# Patient Record
Sex: Female | Born: 1965 | Race: White | Hispanic: No | Marital: Married | Smoking: Former smoker
Health system: Southern US, Community
[De-identification: ages and names within clinical notes are randomized; demographics above are authoritative.]

## PROBLEM LIST (undated history)

## (undated) DIAGNOSIS — H04123 Dry eye syndrome of bilateral lacrimal glands: Secondary | ICD-10-CM

## (undated) DIAGNOSIS — M069 Rheumatoid arthritis, unspecified: Secondary | ICD-10-CM

## (undated) HISTORY — PX: GALLBLADDER SURGERY: SHX652

## (undated) HISTORY — DX: Rheumatoid arthritis, unspecified: M06.9

## (undated) HISTORY — DX: Dry eye syndrome of bilateral lacrimal glands: H04.123

---

## 2002-03-09 ENCOUNTER — Encounter: Payer: Self-pay | Admitting: Rheumatology

## 2002-03-09 ENCOUNTER — Encounter (HOSPITAL_COMMUNITY): Admission: RE | Admit: 2002-03-09 | Discharge: 2002-04-08 | Payer: Self-pay | Admitting: Rheumatology

## 2010-08-01 ENCOUNTER — Ambulatory Visit: Payer: Self-pay | Admitting: Family Medicine

## 2014-03-19 ENCOUNTER — Other Ambulatory Visit: Payer: Self-pay | Admitting: Rheumatology

## 2014-03-19 ENCOUNTER — Ambulatory Visit
Admission: RE | Admit: 2014-03-19 | Discharge: 2014-03-19 | Disposition: A | Discharge status: Home / Self Care | Payer: BC Managed Care – PPO | Source: Ambulatory Visit | Attending: Rheumatology | Admitting: Rheumatology

## 2014-03-19 DIAGNOSIS — M545 Low back pain: Secondary | ICD-10-CM

## 2015-06-15 IMAGING — CR DG LUMBAR SPINE COMPLETE 4+V
5 series · 5 of 5 positions shown · non-contrast
Comparison: None.

CLINICAL DATA: Low back pain worsening over the past month.

EXAM:
LUMBAR SPINE - COMPLETE 4+ VIEW

[view not recorded (1 of 5)]
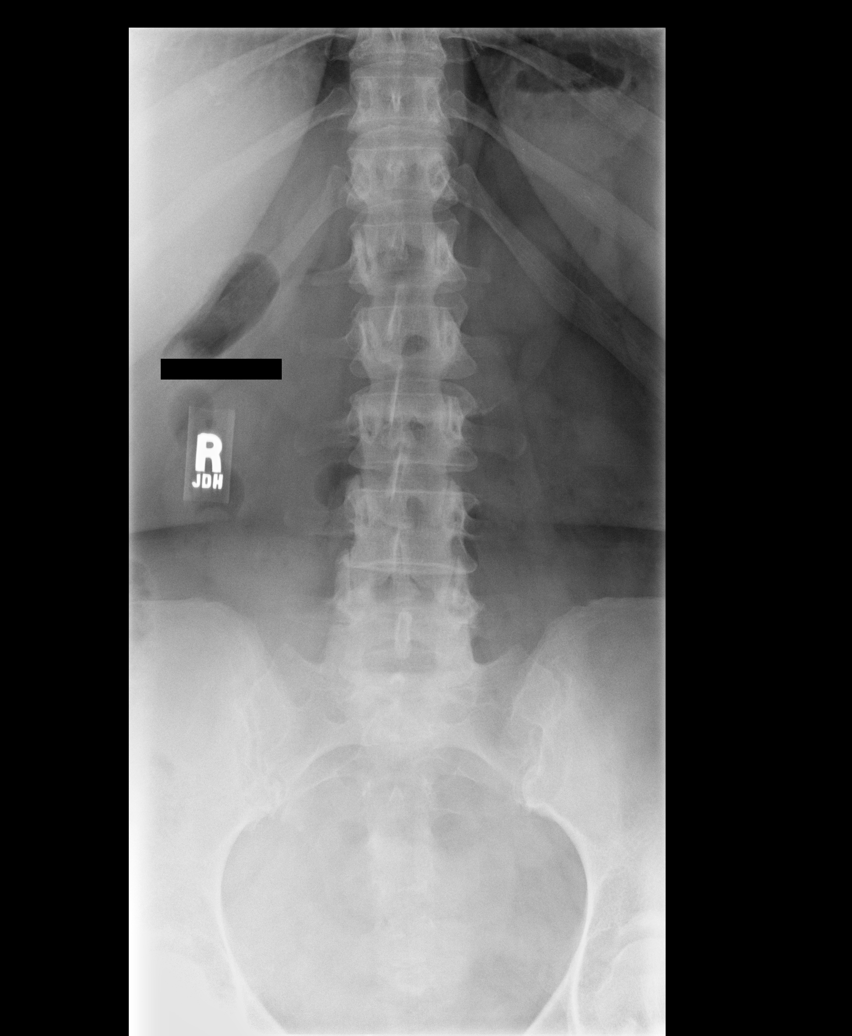

[view not recorded (2 of 5)]
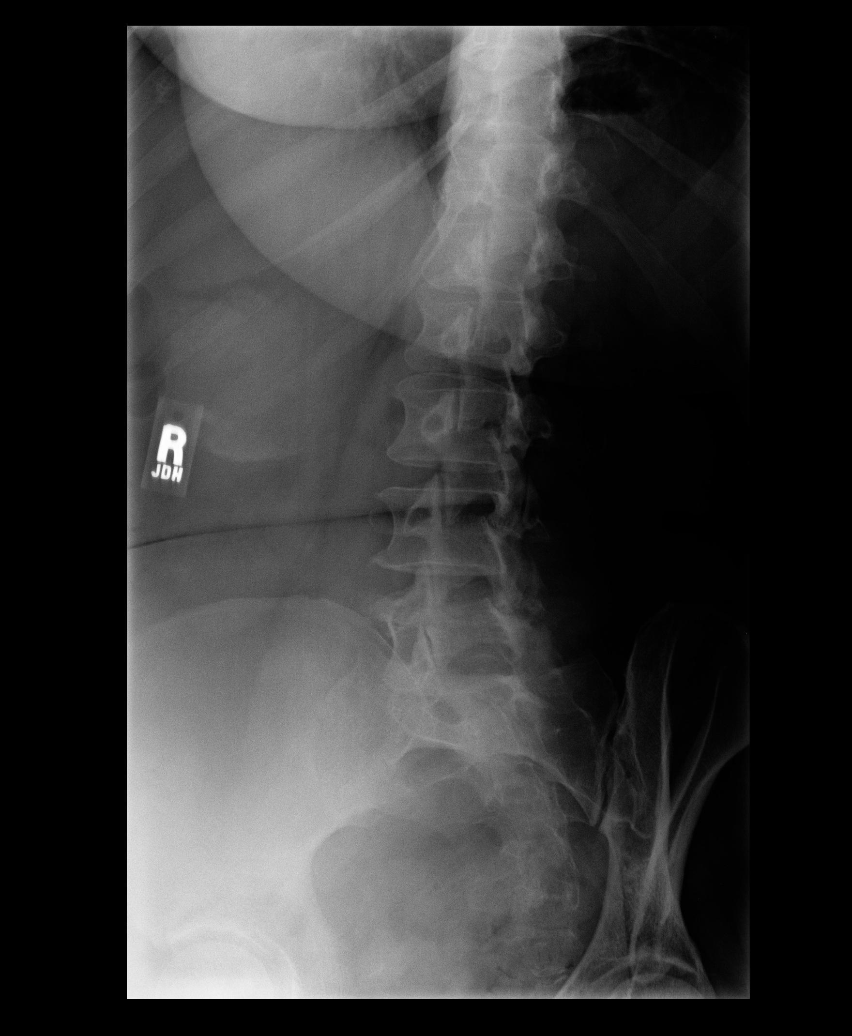

[view not recorded (3 of 5)]
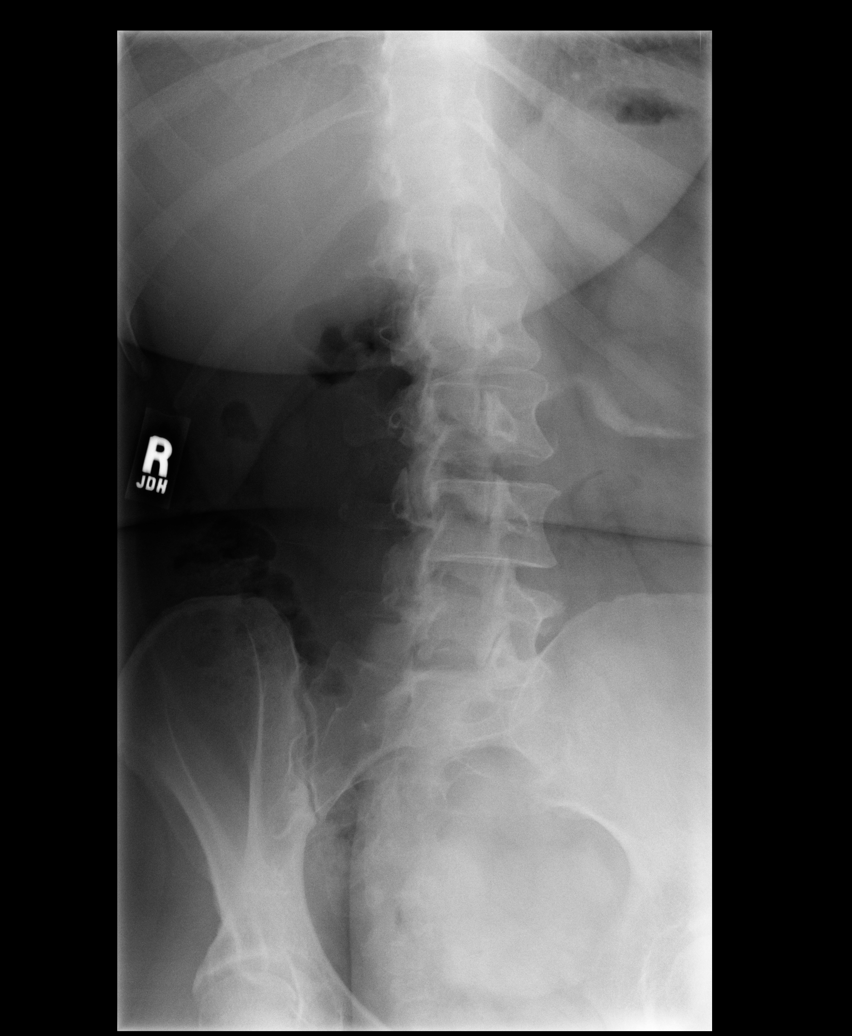

[view not recorded (4 of 5)]
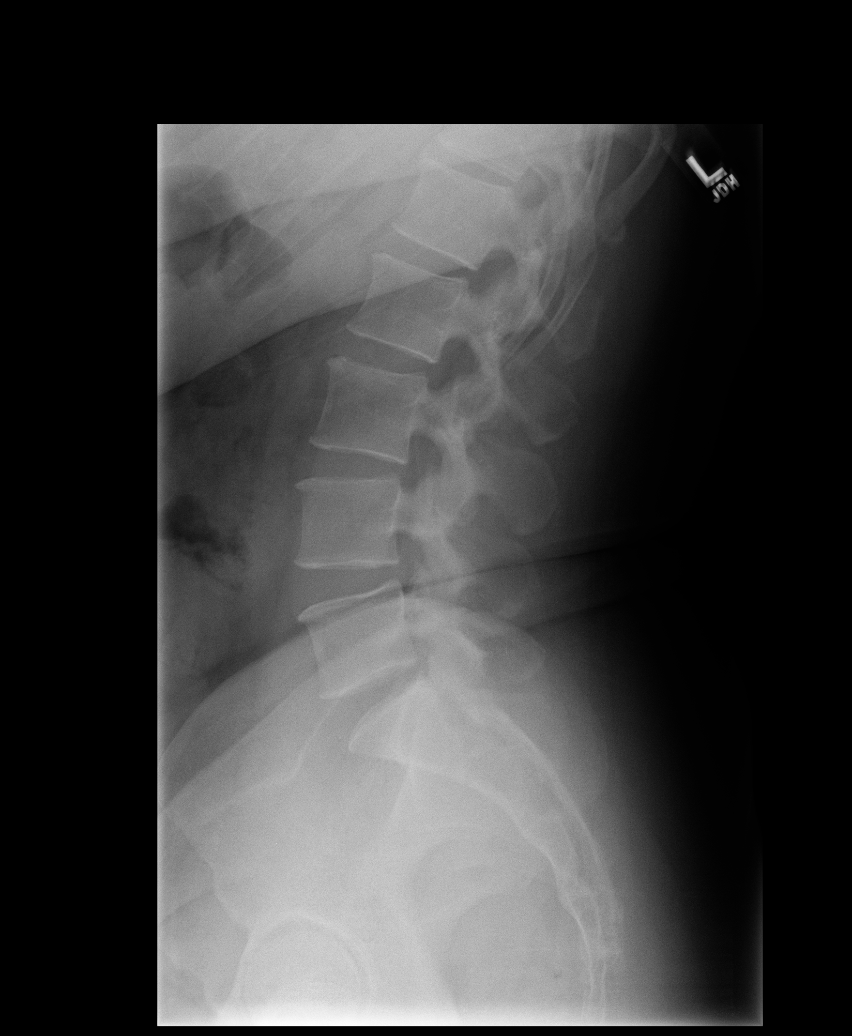

[view not recorded (5 of 5)]
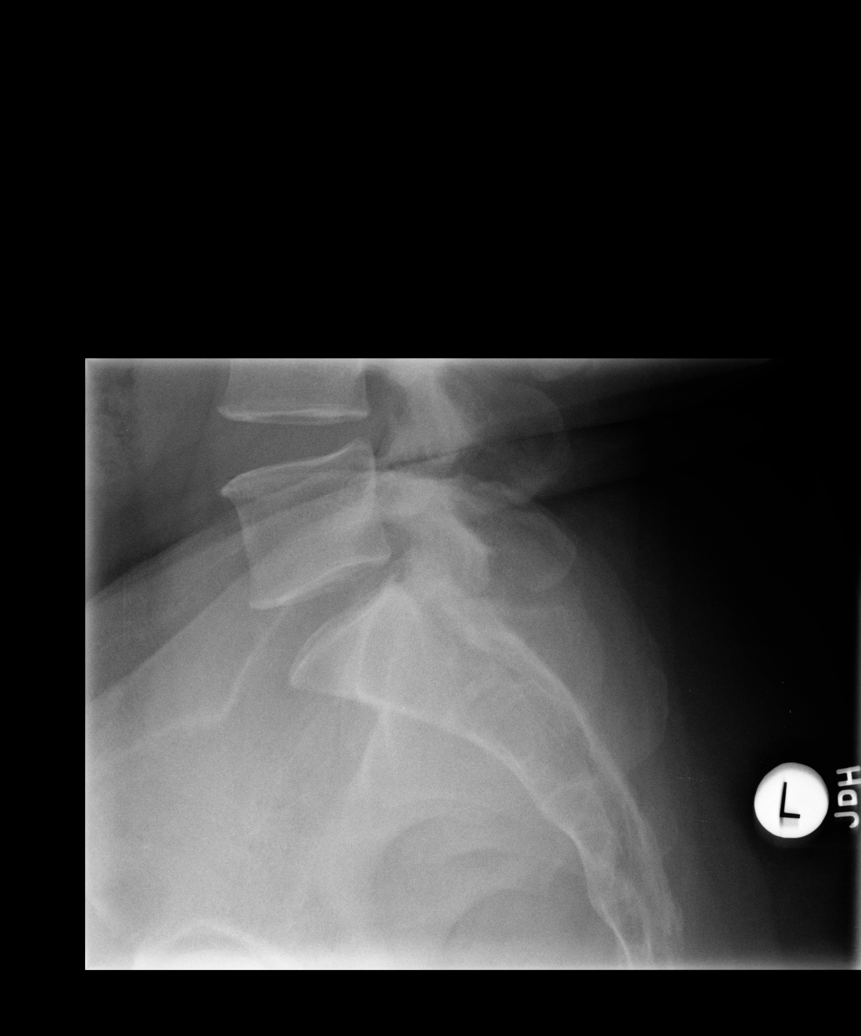

[5 of 5 positions shown; findings below may reference images not displayed]

FINDINGS: Vertebral body height and alignment are maintained. Intervertebral
disc space height is normal. Mild facet arthropathy is seen at
L5-S1. Minimal anterior endplate spurring L3-4 L4-5 is noted.
IMPRESSION: Mild degenerative disease.  Otherwise negative.

## 2016-09-28 DIAGNOSIS — M722 Plantar fascial fibromatosis: Secondary | ICD-10-CM | POA: Insufficient documentation

## 2016-09-28 DIAGNOSIS — M7581 Other shoulder lesions, right shoulder: Secondary | ICD-10-CM

## 2016-09-28 DIAGNOSIS — Z87442 Personal history of urinary calculi: Secondary | ICD-10-CM | POA: Insufficient documentation

## 2016-09-28 DIAGNOSIS — M778 Other enthesopathies, not elsewhere classified: Secondary | ICD-10-CM | POA: Insufficient documentation

## 2016-09-28 DIAGNOSIS — M5136 Other intervertebral disc degeneration, lumbar region: Secondary | ICD-10-CM | POA: Insufficient documentation

## 2016-09-28 DIAGNOSIS — Z79899 Other long term (current) drug therapy: Secondary | ICD-10-CM | POA: Insufficient documentation

## 2016-09-28 DIAGNOSIS — M3500 Sicca syndrome, unspecified: Secondary | ICD-10-CM | POA: Insufficient documentation

## 2016-09-28 DIAGNOSIS — M0579 Rheumatoid arthritis with rheumatoid factor of multiple sites without organ or systems involvement: Secondary | ICD-10-CM | POA: Insufficient documentation

## 2016-09-28 DIAGNOSIS — Z8639 Personal history of other endocrine, nutritional and metabolic disease: Secondary | ICD-10-CM | POA: Insufficient documentation

## 2016-09-28 DIAGNOSIS — Z9049 Acquired absence of other specified parts of digestive tract: Secondary | ICD-10-CM | POA: Insufficient documentation

## 2016-09-28 NOTE — Progress Notes (Signed)
Office Visit Note  Patient: Jessica Rice             Date of Birth: 1965/04/16           MRN: [id number]             PCP: Marrian Salvage, PA-C Referring: Marrian Salvage, PA-C Visit Date: 10/01/2016 Occupation: Teacher     Subjective:  Pain hands   History of Present Illness: Jessica Rice is a 51 y.o. female seen in consultation per request of her PCP. According to patient in 2002 she started having pain in her feet. At the time she was seen by podiatrist who felt that she had rheumatoid arthritis. She states she was seen by her PCP at the time she was having almost swelling in all of her joints. She was placed on prednisone 40 mg a day. She was referred to Dr. Kellie Simmering but it to few months. She states he gradually started weaning her off prednisone as she was asymptomatic on prednisone. When she came down to 10 mg of prednisone a day her symptoms a started flaring again. At that time Dr. Kellie Simmering started on the tracks 8 but it did not help her much. He had it Humira soon after and tapered prednisone gradually. She states 4 years ago she went into remission and stopped all of her medications. She did quite well for a while. But 2 years ago she had another flare for which she was started on Humira. She had a flare in February 2018 for which she was given prednisone. Her symptoms persist with off and on flares. Her PCP added methotrexate in April 2018. She was on 4 tablets by mouth every week and increase to 6 tablets of methotrexate by mouth every week about a month ago. She states she still continues to have flares. Her last flare was a week ago with hand swelling. She showed me the 4 grafts on her phone. She states she does not like taking methotrexate that she does drink some alcohol.  Activities of Daily Living:  Patient reports morning stiffness for 30 minutes.   Patient Reports nocturnal pain.  Difficulty dressing/grooming: Denies Difficulty climbing stairs: Reports Difficulty getting  out of chair: Reports Difficulty using hands for taps, buttons, cutlery, and/or writing: Reports   Review of Systems  Constitutional: Positive for fatigue and weight gain. Negative for night sweats, weight loss and weakness.       Due to prednisone use  HENT: Positive for mouth dryness. Negative for mouth sores, trouble swallowing, trouble swallowing and nose dryness.   Eyes: Positive for dryness. Negative for pain, redness and visual disturbance.  Respiratory: Negative for cough, shortness of breath and difficulty breathing.   Cardiovascular: Negative for chest pain, palpitations, hypertension, irregular heartbeat and swelling in legs/feet.  Gastrointestinal: Negative for blood in stool, constipation and diarrhea.  Endocrine: Negative for increased urination.  Genitourinary: Negative for vaginal dryness.  Musculoskeletal: Positive for arthralgias, joint pain, joint swelling and morning stiffness. Negative for myalgias, muscle weakness, muscle tenderness and myalgias.  Skin: Negative for color change, rash, hair loss, skin tightness, ulcers and sensitivity to sunlight.  Allergic/Immunologic: Negative for susceptible to infections.  Neurological: Negative for dizziness, memory loss and night sweats.  Hematological: Negative for swollen glands.  Psychiatric/Behavioral: Negative for depressed mood and sleep disturbance. The patient is not nervous/anxious.     PMFS History:  Patient Active Problem List   Diagnosis Date Noted  . 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 High risk medication use 09/28/2016  . Right shoulder tendonitis 09/28/2016  . Plantar fasciitis 09/28/2016  . DDD (degenerative disc disease), lumbar 09/28/2016  . History of kidney stones 09/28/2016  . History of cholecystectomy 09/28/2016  . History of hyperlipidemia 09/28/2016  . Sicca syndrome (HCC) 09/28/2016    Past Medical History:  Diagnosis Date  .  Dry eyes     Family History  Problem Relation Age of Onset  . Rheumatologic disease Mother   . Cancer Mother   . Hypertension Father   . Diabetes Brother    Past Surgical History:  Procedure Laterality Date  . GALLBLADDER SURGERY     Social History   Social History Narrative  . No narrative on file     Objective: Vital Signs: BP (!) 141/84   Pulse 98   Resp 14   Ht 5\' 5"  (1.651 m)   Wt 247 lb (112 kg)   BMI 41.10 kg/m    Physical Exam  Constitutional: She is oriented to person, place, and time. She appears well-developed and well-nourished.  HENT:  Head: Normocephalic and atraumatic.  Eyes: Conjunctivae and EOM are normal.  Neck: Normal range of motion.  Cardiovascular: Normal rate, regular rhythm, normal heart sounds and intact distal pulses.   Pulmonary/Chest: Effort normal and breath sounds normal.  Abdominal: Soft. Bowel sounds are normal.  Lymphadenopathy:    She has no cervical adenopathy.  Neurological: She is alert and oriented to person, place, and time.  Skin: Skin is warm and dry. Capillary refill takes less than 2 seconds.  Psychiatric: She has a normal mood and affect. Her behavior is normal.  Nursing note and vitals reviewed.    Musculoskeletal Exam: C-spine and thoracic lumbar spine good range of motion. She is good range of motion of her shoulders elbows wrist joints. She is some thickening of PIP/DIP joints. No synovitis was noted. Hip joints knee joints ankles MTPs PIPs with good range of motion. She appears to have some synovial thickening over her left ankle joint. No synovitis in her lower extremities was noted.  CDAI Exam: CDAI Homunculus Exam:   Tenderness:  Right hand: 3rd PIP LLE: tibiotalar  Joint Counts:  CDAI Tender Joint count: 1 CDAI Swollen Joint count: 0  Global Assessments:  Patient Global Assessment: 3 Provider Global Assessment: 3  CDAI Calculated Score: 7    Investigation: Findings:  4/13/2018CMP shows Glucose  elevated 129, otherwise normal  05/07/2016 extra bilateral hands showed minimal PIP narrowing minimal CMC narrowing no MCP or intercarpal joint space narrowing or erosive changes were noted.     Imaging: Xr Foot 2 Views Left  Result Date: 10/01/2016 PIP/DIP narrowing no MTP joint space narrowing no erosive changes were noted small calcaneal spur was noted. No subpatellar joint space narrowing was noted. Impression these findings were consistent with mild osteoarthritis  Xr Foot 2 Views Right  Result Date: 10/01/2016 PIP/DIP narrowing no MTP joint space narrowing no erosive changes were noted small calcaneal spur was noted. No subpatellar joint space narrowing was noted. Impression these findings were consistent with mild osteoarthritis  Xr Knee 3 View Left  Result Date: 10/01/2016 Moderate medial compartment narrowing was noted., Mild patellofemoral narrowing was noted. No chondrocalcinosis was noted. Impression: These findings are consistent with moderate osteoarthritis and mild chondromalacia patella  Xr Knee 3 View Right  Result Date: 10/01/2016 Moderate medial compartment narrowing was noted. No chondrocalcinosis was noted. Moderate patellofemoral narrowing was noted.  Impression: These studies are consistent with moderate osteoarthritis and moderate chondromalacia patella   Speciality Comments: No specialty comments available.    Procedures:  No procedures performed Allergies: Patient has no known allergies.   Assessment / Plan:     Visit Diagnoses: Rheumatoid arthritis involving multiple sites with positive rheumatoid factor (HCC) +CCP . Patient has known history of rheumatoid arthritis. She showed me a picture on her cell phone where she had synovitis in her right hand about a week ago. She continues to have intermittent flare. It seemed she's not doing as well on the combination of Humira and methotrexate. She's been on Humira for several years. We discussed the option of possibly  switching her to Enbrel. After given her a handout to review. We will obtain following labs today.- Plan: Urinalysis, Routine w reflex microscopic, Sedimentation rate, Rheumatoid factor, Cyclic citrul peptide antibody, IgG  High risk medication use - Humira started by Dr Kellie Simmeringruslow  - Plan: CBC with Differential/Platelet, COMPLETE METABOLIC PANEL WITH GFR, Quantiferon tb gold assay (blood), IgG, IgA, IgM, Serum protein electrophoresis with reflex, Hepatitis B core antibody, IgM, Hepatitis B surface antigen, Hepatitis C antibody, HIV antibody  Chronic pain of both knees - Plan: XR KNEE 3 VIEW RIGHT, XR KNEE 3 VIEW LEFT. 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 She had mild osteoarthritic changes in PIP and DIP joints  Sicca syndrome (HCC): She's been using over-the-counter products  Right shoulder tendonitis: She is intermittent discomfort  DDD (degenerative disc disease), lumbar: Chronic pain  History of kidney stones  History of cholecystectomy  History of hyperlipidemia    Orders: Orders Placed This Encounter  Procedures  . XR KNEE 3 VIEW RIGHT  . XR KNEE 3 VIEW LEFT  . XR Foot 2 Views Right  . XR Foot 2 Views Left  . CBC with Differential/Platelet  . COMPLETE METABOLIC PANEL WITH GFR  . Urinalysis, Routine w reflex microscopic  . Sedimentation rate  . Quantiferon tb gold assay (blood)  . IgG, IgA, IgM  . Serum protein electrophoresis with reflex  . Rheumatoid factor  . Cyclic citrul peptide antibody, IgG  . Hepatitis B core antibody, IgM  . Hepatitis B surface antigen  . Hepatitis C antibody  . HIV antibody   No orders of the defined types were placed in this encounter.   Face-to-face time spent with patient was 50 minutes. 50% of time was spent in counseling and coordination of care.  Follow-Up Instructions: Return for Rheumatoid arthritis.   Pollyann SavoyShaili  Bernice Mullin, MD  Note - This record has been created using Animal nutritionistDragon software.  Chart creation errors have been sought, but may not always  have been located. Such creation errors do not reflect on  the standard of medical care.

## 2016-10-01 ENCOUNTER — Telehealth: Payer: Self-pay

## 2016-10-01 ENCOUNTER — Encounter: Payer: Self-pay | Admitting: Rheumatology

## 2016-10-01 ENCOUNTER — Ambulatory Visit (INDEPENDENT_AMBULATORY_CARE_PROVIDER_SITE_OTHER): Payer: Self-pay

## 2016-10-01 ENCOUNTER — Ambulatory Visit (INDEPENDENT_AMBULATORY_CARE_PROVIDER_SITE_OTHER): Payer: BLUE CROSS/BLUE SHIELD | Admitting: Rheumatology

## 2016-10-01 VITALS — BP 141/84 | HR 98 | Resp 14 | Ht 65.0 in | Wt 247.0 lb

## 2016-10-01 DIAGNOSIS — M3500 Sicca syndrome, unspecified: Secondary | ICD-10-CM

## 2016-10-01 DIAGNOSIS — Z87442 Personal history of urinary calculi: Secondary | ICD-10-CM

## 2016-10-01 DIAGNOSIS — M778 Other enthesopathies, not elsewhere classified: Secondary | ICD-10-CM

## 2016-10-01 DIAGNOSIS — Z8639 Personal history of other endocrine, nutritional and metabolic disease: Secondary | ICD-10-CM

## 2016-10-01 DIAGNOSIS — Z9049 Acquired absence of other specified parts of digestive tract: Secondary | ICD-10-CM | POA: Diagnosis not present

## 2016-10-01 DIAGNOSIS — M79671 Pain in right foot: Secondary | ICD-10-CM | POA: Diagnosis not present

## 2016-10-01 DIAGNOSIS — M0579 Rheumatoid arthritis with rheumatoid factor of multiple sites without organ or systems involvement: Secondary | ICD-10-CM

## 2016-10-01 DIAGNOSIS — M5136 Other intervertebral disc degeneration, lumbar region: Secondary | ICD-10-CM | POA: Diagnosis not present

## 2016-10-01 DIAGNOSIS — M25562 Pain in left knee: Secondary | ICD-10-CM | POA: Diagnosis not present

## 2016-10-01 DIAGNOSIS — G8929 Other chronic pain: Secondary | ICD-10-CM

## 2016-10-01 DIAGNOSIS — M25561 Pain in right knee: Secondary | ICD-10-CM

## 2016-10-01 DIAGNOSIS — M7581 Other shoulder lesions, right shoulder: Secondary | ICD-10-CM | POA: Diagnosis not present

## 2016-10-01 DIAGNOSIS — M79672 Pain in left foot: Secondary | ICD-10-CM | POA: Diagnosis not present

## 2016-10-01 DIAGNOSIS — Z79899 Other long term (current) drug therapy: Secondary | ICD-10-CM

## 2016-10-01 LAB — CBC WITH DIFFERENTIAL/PLATELET
BASOS PCT: 0 %
Basophils Absolute: 0 cells/uL (ref 0–200)
EOS ABS: 141 {cells}/uL (ref 15–500)
Eosinophils Relative: 3 %
HCT: 39.3 % (ref 35.0–45.0)
Hemoglobin: 13.3 g/dL (ref 11.7–15.5)
LYMPHS PCT: 51 %
Lymphs Abs: 2397 cells/uL (ref 850–3900)
MCH: 32.3 pg (ref 27.0–33.0)
MCHC: 33.8 g/dL (ref 32.0–36.0)
MCV: 95.4 fL (ref 80.0–100.0)
MONOS PCT: 13 %
MPV: 11.4 fL (ref 7.5–12.5)
Monocytes Absolute: 611 cells/uL (ref 200–950)
Neutro Abs: 1551 cells/uL (ref 1500–7800)
Neutrophils Relative %: 33 %
PLATELETS: 165 10*3/uL (ref 140–400)
RBC: 4.12 MIL/uL (ref 3.80–5.10)
RDW: 15.2 % — AB (ref 11.0–15.0)
WBC: 4.7 10*3/uL (ref 3.8–10.8)

## 2016-10-01 LAB — COMPLETE METABOLIC PANEL WITH GFR
ALT: 14 U/L (ref 6–29)
AST: 18 U/L (ref 10–35)
Albumin: 3.6 g/dL (ref 3.6–5.1)
Alkaline Phosphatase: 50 U/L (ref 33–130)
BUN: 13 mg/dL (ref 7–25)
CHLORIDE: 107 mmol/L (ref 98–110)
CO2: 21 mmol/L (ref 20–31)
Calcium: 8.9 mg/dL (ref 8.6–10.4)
Creat: 0.96 mg/dL (ref 0.50–1.05)
GFR, EST AFRICAN AMERICAN: 79 mL/min (ref >=60)
GFR, EST NON AFRICAN AMERICAN: 69 mL/min (ref >=60)
Glucose, Bld: 123 mg/dL — ABNORMAL HIGH (ref 65–99)
Potassium: 3.9 mmol/L (ref 3.5–5.3)
Sodium: 138 mmol/L (ref 135–146)
Total Bilirubin: 0.6 mg/dL (ref 0.2–1.2)
Total Protein: 7 g/dL (ref 6.1–8.1)

## 2016-10-01 NOTE — Telephone Encounter (Signed)
Submitted a prior authorization for a new start on Enbrel through cover my meds.   Received a confirmation that the medication has been approved from 10/01/16-10/01/17. Case ID: [id number]    Abran DukeHopkins, Graceland Wachter, CPhT 11:44 AM

## 2016-10-01 NOTE — Patient Instructions (Signed)
Etanercept injection What is this medicine? ETANERCEPT (et a NER sept) is used for the treatment of rheumatoid arthritis in adults and children. The medicine is also used to treat psoriatic arthritis, ankylosing spondylitis, and psoriasis. This medicine may be used for other purposes; ask your health care provider or pharmacist if you have questions. COMMON BRAND NAME(S): Enbrel What should I tell my health care provider before I take this medicine? They need to know if you have any of these conditions: -blood disorders -cancer -congestive heart failure -diabetes -exposure to chickenpox -immune system problems -infection -multiple sclerosis -seizure disorder -tuberculosis, a positive skin test for tuberculosis or have recently been in close contact with someone who has tuberculosis -Wegener's granulomatosis -an unusual or allergic reaction to etanercept, latex, other medicines, foods, dyes, or preservatives -pregnant or trying to get pregnant -breast-feeding How should I use this medicine? The medicine is given by injection under the skin. You will be taught how to prepare and give this medicine. Use exactly as directed. Take your medicine at regular intervals. Do not take your medicine more often than directed. It is important that you put your used needles and syringes in a special sharps container. Do not put them in a trash can. If you do not have a sharps container, call your pharmacist or healthcare provider to get one. A special MedGuide will be given to you by the pharmacist with each prescription and refill. Be sure to read this information carefully each time. Talk to your pediatrician regarding the use of this medicine in children. While this drug may be prescribed for children as young as 4 years of age for selected conditions, precautions do apply. Overdosage: If you think you have taken too much of this medicine contact a poison control center or emergency room at once. NOTE:  This medicine is only for you. Do not share this medicine with others. What if I miss a dose? If you miss a dose, contact your health care professional to find out when you should take your next dose. Do not take double or extra doses without advice. What may interact with this medicine? Do not take this medicine with any of the following medications: -anakinra This medicine may also interact with the following medications: -cyclophosphamide -sulfasalazine -vaccines This list may not describe all possible interactions. Give your health care provider a list of all the medicines, herbs, non-prescription drugs, or dietary supplements you use. Also tell them if you smoke, drink alcohol, or use illegal drugs. Some items may interact with your medicine. What should I watch for while using this medicine? Tell your doctor or healthcare professional if your symptoms do not start to get better or if they get worse. You will be tested for tuberculosis (TB) before you start this medicine. If your doctor prescribes any medicine for TB, you should start taking the TB medicine before starting this medicine. Make sure to finish the full course of TB medicine. Call your doctor or health care professional for advice if you get a fever, chills or sore throat, or other symptoms of a cold or flu. Do not treat yourself. This drug decreases your body's ability to fight infections. Try to avoid being around people who are sick. What side effects may I notice from receiving this medicine? Side effects that you should report to your doctor or health care professional as soon as possible: -allergic reactions like skin rash, itching or hives, swelling of the face, lips, or tongue -changes in vision -fever, chills   or any other sign of infection -numbness or tingling in legs or other parts of the body -red, scaly patches or raised bumps on the skin -shortness of breath or difficulty breathing -swollen lymph nodes in the  neck, underarm, or groin areas -unexplained weight loss -unusual bleeding or bruising -unusual swelling or fluid retention in the legs -unusually weak or tired Side effects that usually do not require medical attention (report to your doctor or health care professional if they continue or are bothersome): -dizziness -headache -nausea -redness, itching, or swelling at the injection site -vomiting This list may not describe all possible side effects. Call your doctor for medical advice about side effects. You may report side effects to FDA at 1-800-FDA-1088. Where should I keep my medicine? Keep out of the reach of children. Store between 2 and 8 degrees C (36 and 46 degrees F). Do not freeze or shake. Protect from light. Throw away any unused medicine after the expiration date. You will be instructed on how to store this medicine. NOTE: This sheet is a summary. It may not cover all possible information. If you have questions about this medicine, talk to your doctor, pharmacist, or health care provider.  2018 Elsevier/Gold Standard (2011-09-21 15:33:36)  

## 2016-10-02 LAB — URINALYSIS, ROUTINE W REFLEX MICROSCOPIC
Bilirubin Urine: NEGATIVE
Glucose, UA: NEGATIVE
Hgb urine dipstick: NEGATIVE
Ketones, ur: NEGATIVE
NITRITE: NEGATIVE
PH: 6.5 (ref 5.0–8.0)
Protein, ur: NEGATIVE
SPECIFIC GRAVITY, URINE: 1.019 (ref 1.001–1.035)

## 2016-10-02 LAB — URINALYSIS, MICROSCOPIC ONLY
Casts: NONE SEEN [LPF]
Crystals: NONE SEEN [HPF]
YEAST: NONE SEEN [HPF]

## 2016-10-02 LAB — HEPATITIS B SURFACE ANTIGEN: Hepatitis B Surface Ag: NEGATIVE

## 2016-10-02 LAB — IGG, IGA, IGM
IGG (IMMUNOGLOBIN G), SERUM: 1642 mg/dL — AB (ref 694–1618)
IgA: 382 mg/dL (ref 81–463)
IgM, Serum: 200 mg/dL (ref 48–271)

## 2016-10-02 LAB — CYCLIC CITRUL PEPTIDE ANTIBODY, IGG

## 2016-10-02 LAB — SEDIMENTATION RATE: Sed Rate: 30 mm/hr (ref 0–30)

## 2016-10-02 LAB — RHEUMATOID FACTOR: Rhuematoid fact SerPl-aCnc: 21 IU/mL — ABNORMAL HIGH (ref <14)

## 2016-10-02 LAB — HIV ANTIBODY (ROUTINE TESTING W REFLEX): HIV: NONREACTIVE

## 2016-10-02 LAB — HEPATITIS B CORE ANTIBODY, IGM: Hep B C IgM: NONREACTIVE

## 2016-10-02 LAB — HEPATITIS C ANTIBODY: HCV AB: NEGATIVE

## 2016-10-03 LAB — QUANTIFERON TB GOLD ASSAY (BLOOD)
INTERFERON GAMMA RELEASE ASSAY: NEGATIVE
Mitogen-Nil: 7.76 IU/mL
QUANTIFERON NIL VALUE: 0.3 [IU]/mL
QUANTIFERON TB AG MINUS NIL: 0.02 [IU]/mL

## 2016-10-05 LAB — PROTEIN ELECTROPHORESIS, SERUM, WITH REFLEX
ALPHA-1-GLOBULIN: 0.5 g/dL — AB (ref 0.2–0.3)
ALPHA-2-GLOBULIN: 0.6 g/dL (ref 0.5–0.9)
Albumin ELP: 3.4 g/dL — ABNORMAL LOW (ref 3.8–4.8)
BETA GLOBULIN: 0.5 g/dL (ref 0.4–0.6)
Beta 2: 0.4 g/dL (ref 0.2–0.5)
GAMMA GLOBULIN: 1.5 g/dL (ref 0.8–1.7)
Total Protein, Serum Electrophoresis: 7 g/dL (ref 6.1–8.1)

## 2016-10-05 NOTE — Progress Notes (Signed)
Labs consistent with rheumatoid arthritis

## 2016-10-05 NOTE — Telephone Encounter (Signed)
I informed patient that Enbrel was approved by her insurance.  Advised patient that first Enbrel dose must be administered in office at least two weeks after most recent Humira.  Patient reports she took most recent Humira 09/27/16.  Discussed starting Enbrel after 10/11/16.  Patient reports she will be on vacation.  She has Humira dose for 10/11/16 and wants to wait until new patient follow up visit on 10/30/16 to start Enbrel.  Advised patient not to take Humira on 10/25/16.  Patient voiced understanding.   I provided patient with information to sign up for Enbrel Support copay card.  Patient denies any questions or concerns at this time.   Lilla Shookachel Henderson, Pharm.D., BCPS, CPP Clinical Pharmacist Pager: [phone] Phone: [phone] 10/05/2016 9:17 AM

## 2016-10-26 NOTE — Progress Notes (Signed)
Office Visit Note  Patient: Jessica Rice             Date of Birth: 09-01-1965           MRN: [id number]             PCP: Aldean Ast, PA-C Referring: No ref. provider found Visit Date: 10/30/2016 Occupation: '@GUAROCC'$ @    Subjective:  Pain in hands   History of Present Illness: Jessica Rice is a 51 y.o. female with history of sero positive rheumatoid arthritis. She's been having increased pain and discomfort in her hands. She has a stiffness in her bilateral feet especially in the morning and some discomfort which gets better during the day. She does have some discomfort in her shoulders at nighttime. Occasional pain in her left elbow. He is stiff in  Activities of Daily Living:  Patient reports morning stiffness for 20 minutes.   Patient Reports nocturnal pain. Shoulders Difficulty dressing/grooming: Denies Difficulty climbing stairs: Denies Difficulty getting out of chair: Denies Difficulty using hands for taps, buttons, cutlery, and/or writing: Denies   Review of Systems  Constitutional: Positive for fatigue. Negative for night sweats, weight gain, weight loss and weakness.  HENT: Positive for mouth dryness. Negative for mouth sores, trouble swallowing, trouble swallowing and nose dryness.   Eyes: Negative for pain, redness, visual disturbance and dryness.  Respiratory: Negative for cough, shortness of breath and difficulty breathing.   Cardiovascular: Negative for chest pain, palpitations, hypertension, irregular heartbeat and swelling in legs/feet.  Gastrointestinal: Negative for blood in stool, constipation and diarrhea.  Endocrine: Negative for increased urination.  Genitourinary: Negative for vaginal dryness.  Musculoskeletal: Positive for arthralgias, joint pain, joint swelling and morning stiffness. Negative for myalgias, muscle weakness, muscle tenderness and myalgias.  Skin: Negative for color change, rash, hair loss, skin tightness, ulcers and sensitivity to  sunlight.  Allergic/Immunologic: Negative for susceptible to infections.  Neurological: Negative for dizziness, memory loss and night sweats.  Hematological: Negative for swollen glands.  Psychiatric/Behavioral: Negative for depressed mood and sleep disturbance. The patient is not nervous/anxious.     PMFS History:  Patient Active Problem List   Diagnosis Date Noted  . Rheumatoid arthritis involving multiple sites with positive rheumatoid factor ([place]) +CCP  09/28/2016  . High risk medication use 09/28/2016  . Right shoulder tendonitis 09/28/2016  . Plantar fasciitis 09/28/2016  . DDD (degenerative disc disease), lumbar 09/28/2016  . History of kidney stones 09/28/2016  . History of cholecystectomy 09/28/2016  . History of hyperlipidemia 09/28/2016  . Sicca syndrome ([place]) 09/28/2016    Past Medical History:  Diagnosis Date  . Dry eyes     Family History  Problem Relation Age of Onset  . Rheumatologic disease Mother   . Cancer Mother   . Hypertension Father   . Diabetes Brother    Past Surgical History:  Procedure Laterality Date  . GALLBLADDER SURGERY     Social History   Social History Narrative  . No narrative on file     Objective: Vital Signs: BP (!) 128/93 (BP Location: Left Arm, Patient Position: Sitting, Cuff Size: Normal)   Pulse 92   Ht 5' 5.5" (1.664 m)   Wt 245 lb (111.1 kg)   BMI 40.15 kg/m    Physical Exam  Constitutional: She is oriented to person, place, and time. She appears well-developed and well-nourished.  HENT:  Head: Normocephalic and atraumatic.  Eyes: Conjunctivae and EOM are normal.  Neck: Normal range of motion.  Cardiovascular:  Normal rate, regular rhythm, normal heart sounds and intact distal pulses.   Pulmonary/Chest: Effort normal and breath sounds normal.  Abdominal: Soft. Bowel sounds are normal.  Lymphadenopathy:    She has no cervical adenopathy.  Neurological: She is alert and oriented to person, place, and time.  Skin:  Skin is warm and dry. Capillary refill takes less than 2 seconds.  Psychiatric: She has a normal mood and affect. Her behavior is normal.  Nursing note and vitals reviewed.    Musculoskeletal Exam: C-spine and thoracic lumbar spine good range of motion. Shoulder joints elbow joints wrist joints are good range of motion. She has some synovitis over her PIP joints as described below. Hip joints, knee joints, ankles and MTPs were good range of motion with no synovitis.  CDAI Exam: CDAI Homunculus Exam:   Tenderness:  RUE: glenohumeral LUE: glenohumeral Right hand: 2nd PIP, 3rd PIP, 4th PIP and 5th PIP Left hand: 2nd PIP  Swelling:  Right hand: 3rd PIP, 4th PIP and 5th PIP  Joint Counts:  CDAI Tender Joint count: 7 CDAI Swollen Joint count: 3  Global Assessments:  Patient Global Assessment: 2 Provider Global Assessment: 2  CDAI Calculated Score: 14    Investigation: No additional findings. July 15,018 CBC normal, CMP glucose 123, UA negative, SPEP nonspecific, immunoglobulins IgG mildly elevated, TB gold negative, hepatitis panel negative, HIV negative, ESR 30, RF 21, anti-CCP greater than 250  Imaging: Xr Foot 2 Views Left  Result Date: 10/01/2016 PIP/DIP narrowing no MTP joint space narrowing no erosive changes were noted small calcaneal spur was noted. No subpatellar joint space narrowing was noted. Impression these findings were consistent with mild osteoarthritis  Xr Foot 2 Views Right  Result Date: 10/01/2016 PIP/DIP narrowing no MTP joint space narrowing no erosive changes were noted small calcaneal spur was noted. No subpatellar joint space narrowing was noted. Impression these findings were consistent with mild osteoarthritis  Xr Knee 3 View Left  Result Date: 10/01/2016 Moderate medial compartment narrowing was noted., Mild patellofemoral narrowing was noted. No chondrocalcinosis was noted. Impression: These findings are consistent with moderate osteoarthritis and  mild chondromalacia patella  Xr Knee 3 View Right  Result Date: 10/01/2016 Moderate medial compartment narrowing was noted. No chondrocalcinosis was noted. Moderate patellofemoral narrowing was noted. Impression: These studies are consistent with moderate osteoarthritis and moderate chondromalacia patella   Speciality Comments: No specialty comments available.    Procedures:  No procedures performed Allergies: Patient has no known allergies.   Assessment / Plan:     Visit Diagnoses: Rheumatoid arthritis involving multiple sites with positive rheumatoid factor (HCC) +CCP she's been having some pain and stiffness in her bilateral hands. She has synovitis in her PIPs as described above. She has done quite well on Humira in the past which is ineffective now. We will start her on her first Enbrel injection today and she'll be observed in the office for 30 minutes. Indications side effects contraindications were reviewed.  High risk medication use - Methotrexate 4 tablets by mouth every week, folic acid 1 mg by mouth daily, Humira 40 mg subcutaneous every other OLMB(8675) (plan start Enbrel) starting first Enbrel injection today. Then she will take Enbrel once a week. We will check labs in a month and then every 3 months to monitor for drug toxicity. - Plan: CBC with Differential/Platelet, CMP14+EGFR  Sicca syndrome (HCC) - Restasis eyedrops  Right shoulder tendonitis: She does have some nocturnal pain in her shoulders.  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 Weight loss diet and exercise was discussed.  Plantar fasciitis: Doing better  Primary osteoarthritis of both feet: Proper fitting shoes discussed.  DDD (degenerative disc disease), lumbar: Chronic pain  History of hyperlipidemia  History of kidney stones   Class III obesity: Weight loss diet and exercise was discussed.  Association of heart disease with  rheumatoid arthritis was discussed. Need to monitor blood pressure, cholesterol, and to exercise 30-60 minutes on daily basis was discussed. Poor dental hygiene can be a predisposing factor for rheumatoid arthritis. Good dental hygiene was discussed.   Orders: Orders Placed This Encounter  Procedures  . CBC with Differential/Platelet  . CMP14+EGFR   Meds ordered this encounter  Medications  . folic acid (FOLVITE) 1 MG tablet    Sig: Take 1 tablet (1 mg total) by mouth daily.    Dispense:  90 tablet    Refill:  3    Face-to-face time spent with patient was 30 minutes. Greater than 50% of time was spent in counseling and coordination of care.  Follow-Up Instructions: Return in about 3 months (around 01/30/2017) for Rheumatoid arthritis.   Bo Merino, MD  Note - This record has been created using Editor, commissioning.  Chart creation errors have been sought, but may not always  have been located. Such creation errors do not reflect on  the standard of medical care.

## 2016-10-30 ENCOUNTER — Ambulatory Visit (INDEPENDENT_AMBULATORY_CARE_PROVIDER_SITE_OTHER): Payer: BLUE CROSS/BLUE SHIELD | Admitting: Rheumatology

## 2016-10-30 ENCOUNTER — Encounter: Payer: Self-pay | Admitting: Rheumatology

## 2016-10-30 VITALS — BP 124/91 | HR 89 | Ht 65.5 in | Wt 245.0 lb

## 2016-10-30 DIAGNOSIS — M722 Plantar fascial fibromatosis: Secondary | ICD-10-CM

## 2016-10-30 DIAGNOSIS — M170 Bilateral primary osteoarthritis of knee: Secondary | ICD-10-CM | POA: Diagnosis not present

## 2016-10-30 DIAGNOSIS — M3500 Sicca syndrome, unspecified: Secondary | ICD-10-CM | POA: Diagnosis not present

## 2016-10-30 DIAGNOSIS — M5136 Other intervertebral disc degeneration, lumbar region: Secondary | ICD-10-CM

## 2016-10-30 DIAGNOSIS — M0579 Rheumatoid arthritis with rheumatoid factor of multiple sites without organ or systems involvement: Secondary | ICD-10-CM | POA: Diagnosis not present

## 2016-10-30 DIAGNOSIS — M7581 Other shoulder lesions, right shoulder: Secondary | ICD-10-CM

## 2016-10-30 DIAGNOSIS — Z8639 Personal history of other endocrine, nutritional and metabolic disease: Secondary | ICD-10-CM | POA: Diagnosis not present

## 2016-10-30 DIAGNOSIS — Z79899 Other long term (current) drug therapy: Secondary | ICD-10-CM

## 2016-10-30 DIAGNOSIS — Z87442 Personal history of urinary calculi: Secondary | ICD-10-CM

## 2016-10-30 DIAGNOSIS — M19071 Primary osteoarthritis, right ankle and foot: Secondary | ICD-10-CM | POA: Diagnosis not present

## 2016-10-30 DIAGNOSIS — M19072 Primary osteoarthritis, left ankle and foot: Secondary | ICD-10-CM

## 2016-10-30 DIAGNOSIS — M778 Other enthesopathies, not elsewhere classified: Secondary | ICD-10-CM

## 2016-10-30 DIAGNOSIS — Z6841 Body Mass Index (BMI) 40.0 and over, adult: Secondary | ICD-10-CM

## 2016-10-30 MED ORDER — FOLIC ACID 1 MG PO TABS: 1.0000 mg | ORAL_TABLET | Freq: Every day | ORAL | 3 refills | Status: DC

## 2016-10-30 MED ORDER — ETANERCEPT 50 MG/ML ~~LOC~~ SOAJ: 50.0000 mg | SUBCUTANEOUS | 2 refills | Status: DC

## 2016-10-30 NOTE — Progress Notes (Signed)
Pharmacy Note  Subjective: Patient presents today to the [facility] to see Dr. Corliss Skainseveshwar.  Patient was seen last on 10/01/16.  The plan was to stop Humira and apply for Enbrel.  Enbrel has been approved by patient's insurance and patient plans to get initial Enbrel injection today.  She confirms most recent Humria was on 715/18, which was more than 2 weeks ago.  Patient seen by the pharmacist for counseling on Enbrel.    Objective: TB Test: negative (10/01/16) Hepatitis panel: negative (10/01/16) HIV: negative (10/01/16)  CBC    Component Value Date/Time   WBC 4.7 10/01/2016 1235   RBC 4.12 10/01/2016 1235   HGB 13.3 10/01/2016 1235   HCT 39.3 10/01/2016 1235   PLT 165 10/01/2016 1235   MCV 95.4 10/01/2016 1235   MCH 32.3 10/01/2016 1235   MCHC 33.8 10/01/2016 1235   RDW 15.2 (H) 10/01/2016 1235   LYMPHSABS 2,397 10/01/2016 1235   MONOABS 611 10/01/2016 1235   EOSABS 141 10/01/2016 1235   BASOSABS 0 10/01/2016 1235    Assessment/Plan:  Counseled patient that Enbrel is a TNF blocking agent.  Reviewed Enbrel dose of 50 mg once weekly.  Counseled patient on purpose, proper use, and adverse effects of Enbrel.  Reviewed the most common adverse effects including infections, headache, and injection site reactions. Discussed that there is the possibility of an increased risk of malignancy but it is not well understood if this increased risk is due to the medication or the disease state.  Advised patient to get yearly dermatology exams due to risk of skin cancer.  Reviewed the importance of regular labs while on Enbrel therapy.  Advised patient to get standing labs one month after starting Enbrel then every 3 months.  Provided patient with standing lab orders.  Counseled patient that Enbrel should be held prior to scheduled surgery.  Counseled patient to avoid live vaccines while on Enbrel.  Advised patient to get annual influenza vaccine and the pneumococcal vaccine as needed.  Provided  patient with medication education material and answered all questions.  Patient voiced understanding.  Patient consented to Enbrel.  Will upload consent into the media tab.  Reviewed storage instructions for Enbrel.    Patient was counseled on how to administer subcutaneous Enbrel injection using a demonstration pen.  Patient received her first dose of Enbrel in the right thigh.  Lot: [id number], Exp. 07/20.  Patient was monitored for 30 minutes post injection.  No injection site reaction noted.    Patient is currently taking methotrexate 6 tablets weekly and over the counter folic acid. I counseled patient that she should be taking at least 1 mg of folic acid daily.  Folic acid prescription was sent to the pharmacy.  I do not see a chest x-ray on patient's profile.  Patient confirms she had a chest [facility] at [facility] in the past year.  I called [facility] and spoke to [place] and requested a copy of the chest x-ray results.  She confirms they will fax over the results.    Lilla Shookachel Otila Starn, Pharm.D., BCPS Clinical Pharmacist Pager: [phone] Phone: [phone] 10/30/2016 11:21 AM

## 2016-10-30 NOTE — Patient Instructions (Addendum)
Standing Labs We placed an order today for your standing lab work.    Please come back and get your standing labs in 1 month then every 3 months  We have open lab Monday through Friday from 8:30-11:30 AM and 1:30-4 PM at the office of Dr. Pollyann SavoyShaili Deveshwar.   The office is located at [address], [address], [place], [place] [address] No appointment is necessary.   Labs are drawn by [facility].  You may receive a bill from [facility] for your lab work. If you have any questions regarding directions or hours of operation,  please call [phone].    Etanercept injection What is this medicine? ETANERCEPT (et a MotorolaER sept) is used for the treatment of rheumatoid arthritis in adults and children. The medicine is also used to treat psoriatic arthritis, ankylosing spondylitis, and psoriasis. This medicine may be used for other purposes; ask your health care provider or pharmacist if you have questions. COMMON BRAND NAME(S): Enbrel What should I tell my health care provider before I take this medicine? They need to know if you have any of these conditions: -blood disorders -cancer -congestive heart failure -diabetes -exposure to chickenpox -immune system problems -infection -multiple sclerosis -seizure disorder -tuberculosis, a positive skin test for tuberculosis or have recently been in close contact with someone who has tuberculosis -Wegener's granulomatosis -an unusual or allergic reaction to etanercept, latex, other medicines, foods, dyes, or preservatives -pregnant or trying to get pregnant -breast-feeding How should I use this medicine? The medicine is given by injection under the skin. You will be taught how to prepare and give this medicine. Use exactly as directed. Take your medicine at regular intervals. Do not take your medicine more often than directed. It is important that you put your used needles and syringes in a special sharps container. Do not put them in a trash can. If you  do not have a sharps container, call your pharmacist or healthcare provider to get one. A special MedGuide will be given to you by the pharmacist with each prescription and refill. Be sure to read this information carefully each time. Talk to your pediatrician regarding the use of this medicine in children. While this drug may be prescribed for children as young as 314 years of age for selected conditions, precautions do apply. Overdosage: If you think you have taken too much of this medicine contact a poison control center or emergency room at once. NOTE: This medicine is only for you. Do not share this medicine with others. What if I miss a dose? If you miss a dose, contact your health care professional to find out when you should take your next dose. Do not take double or extra doses without advice. What may interact with this medicine? Do not take this medicine with any of the following medications: -anakinra This medicine may also interact with the following medications: -cyclophosphamide -sulfasalazine -vaccines This list may not describe all possible interactions. Give your health care provider a list of all the medicines, herbs, non-prescription drugs, or dietary supplements you use. Also tell them if you smoke, drink alcohol, or use illegal drugs. Some items may interact with your medicine. What should I watch for while using this medicine? Tell your doctor or healthcare professional if your symptoms do not start to get better or if they get worse. You will be tested for tuberculosis (TB) before you start this medicine. If your doctor prescribes any medicine for TB, you should start taking the TB medicine before starting  this medicine. Make sure to finish the full course of TB medicine. Call your doctor or health care professional for advice if you get a fever, chills or sore throat, or other symptoms of a cold or flu. Do not treat yourself. This drug decreases your body's ability to fight  infections. Try to avoid being around people who are sick. What side effects may I notice from receiving this medicine? Side effects that you should report to your doctor or health care professional as soon as possible: -allergic reactions like skin rash, itching or hives, swelling of the face, lips, or tongue -changes in vision -fever, chills or any other sign of infection -numbness or tingling in legs or other parts of the body -red, scaly patches or raised bumps on the skin -shortness of breath or difficulty breathing -swollen lymph nodes in the neck, underarm, or groin areas -unexplained weight loss -unusual bleeding or bruising -unusual swelling or fluid retention in the legs -unusually weak or tired Side effects that usually do not require medical attention (report to your doctor or health care professional if they continue or are bothersome): -dizziness -headache -nausea -redness, itching, or swelling at the injection site -vomiting This list may not describe all possible side effects. Call your doctor for medical advice about side effects. You may report side effects to FDA at 1-800-FDA-1088. Where should I keep my medicine? Keep out of the reach of children. Store between 2 and 8 degrees C (36 and 46 degrees F). Do not freeze or shake. Protect from light. Throw away any unused medicine after the expiration date. You will be instructed on how to store this medicine. NOTE: This sheet is a summary. It may not cover all possible information. If you have questions about this medicine, talk to your doctor, pharmacist, or health care provider.  2018 Elsevier/Gold Standard (2011-09-21 15:33:36)  Association of heart disease with rheumatoid arthritis was discussed. Need to monitor blood pressure, cholesterol, and to exercise 30-60 minutes on daily basis was discussed. Poor dental hygiene can be a predisposing factor for rheumatoid arthritis. Good dental hygiene was discussed.

## 2016-11-02 ENCOUNTER — Telehealth: Payer: Self-pay

## 2016-11-02 NOTE — Telephone Encounter (Signed)
Called patient to set up [facility]. Left message for patient to call back.   Hailea Eaglin, [place], CPhT 8:40 AM

## 2016-11-04 MED FILL — ENBREL 50 MG/ML SURECLICK S: 50 | 28 days supply | Qty: 4 | Fill #0

## 2016-11-05 ENCOUNTER — Telehealth: Payer: Self-pay | Admitting: Rheumatology

## 2016-11-05 NOTE — Telephone Encounter (Signed)
Patient called when computers were down 11/04/16. Returning your call.

## 2016-12-01 MED FILL — ENBREL 50 MG/ML SURECLICK S: 50 | 28 days supply | Qty: 4 | Fill #1

## 2016-12-15 ENCOUNTER — Telehealth: Payer: Self-pay | Admitting: *Deleted

## 2016-12-15 NOTE — Telephone Encounter (Signed)
Lab received that were drawn on 12/03/16. Reviewed by Dr. Corliss Skains Creatinine 1.09 GFR 59 All other results WNL. Per Dr. Corliss Skains patient to decrease MTX to 5 tabs per week. Patient advised and verbalized understanding.

## 2016-12-31 MED FILL — ENBREL 50 MG/ML SURECLICK S: 50 | 28 days supply | Qty: 4 | Fill #2

## 2017-01-17 NOTE — Progress Notes (Signed)
Office Visit Note  Patient: Jessica Rice             Date of Birth: [birth year]           MRN: [id number]             PCP: Marrian Salvage, PA-C Referring: Marrian Salvage, PA-C Visit Date: 01/29/2017 Occupation: @GUAROCC @    Subjective:  Joint stiffness.   History of Present Illness: Jessica Rice is a 51 y.o. female with history of sero positive rheumatoid arthritis. She states she's been experiencing some stiffness in her hands but no joint swelling. She seems to be doing well on Enbrel and methotrexate combination. She's been using Restasis for sicca symptoms which has been helpful. She had some left knee joint discomfort with the weather change but it is better now. The plantar fasciitis has resolved. She still has lower back pain off and on.  Activities of Daily Living:  Patient reports morning stiffness for 15 minutes.   Patient Denies nocturnal pain.  Difficulty dressing/grooming: Denies Difficulty climbing stairs: Denies Difficulty getting out of chair: Denies Difficulty using hands for taps, buttons, cutlery, and/or writing: Denies   Review of Systems  Constitutional: Negative for fatigue, night sweats, weight gain, weight loss and weakness.  HENT: Positive for mouth dryness. Negative for mouth sores, trouble swallowing, trouble swallowing and nose dryness.   Eyes: Positive for dryness. Negative for pain, redness and visual disturbance.  Respiratory: Negative for cough, shortness of breath and difficulty breathing.   Cardiovascular: Positive for hypertension. Negative for chest pain, palpitations, irregular heartbeat and swelling in legs/feet.  Gastrointestinal: Negative for blood in stool, constipation and diarrhea.  Endocrine: Negative for increased urination.  Genitourinary: Negative for vaginal dryness.  Musculoskeletal: Positive for arthralgias, joint pain and morning stiffness. Negative for joint swelling, myalgias, muscle weakness, muscle tenderness and myalgias.   Skin: Negative for color change, rash, hair loss, skin tightness, ulcers and sensitivity to sunlight.  Allergic/Immunologic: Negative for susceptible to infections.  Neurological: Negative for dizziness, memory loss and night sweats.  Hematological: Negative for swollen glands.  Psychiatric/Behavioral: Negative for depressed mood and sleep disturbance. The patient is not nervous/anxious.     PMFS History:  Patient Active Problem List   Diagnosis Date Noted  . Rheumatoid arthritis involving multiple sites with positive rheumatoid factor (HCC) +CCP  09/28/2016  . High risk medication use 09/28/2016  . Right shoulder tendonitis 09/28/2016  . Plantar fasciitis 09/28/2016  . DDD (degenerative disc disease), lumbar 09/28/2016  . History of kidney stones 09/28/2016  . History of cholecystectomy 09/28/2016  . History of hyperlipidemia 09/28/2016  . Sicca syndrome (HCC) 09/28/2016    Past Medical History:  Diagnosis Date  . Dry eyes     Family History  Problem Relation Age of Onset  . Rheumatologic disease Mother   . Cancer Mother   . Hypertension Father   . Diabetes Brother    Past Surgical History:  Procedure Laterality Date  . GALLBLADDER SURGERY     Social History   Social History Narrative  . No narrative on file     Objective: Vital Signs: BP 138/80 (BP Location: Left Arm, Patient Position: Sitting, Cuff Size: Normal)   Pulse 75   Resp 17   Ht 5\' 5"  (1.651 m)   Wt 236 lb (107 kg)   BMI 39.27 kg/m    Physical Exam  Constitutional: She is oriented to person, place, and time. She appears well-developed and well-nourished.  HENT:  Head:  Normocephalic and atraumatic.  Eyes: Conjunctivae and EOM are normal.  Neck: Normal range of motion.  Cardiovascular: Normal rate, regular rhythm, normal heart sounds and intact distal pulses.   Pulmonary/Chest: Effort normal and breath sounds normal.  Abdominal: Soft. Bowel sounds are normal.  Lymphadenopathy:    She has no  cervical adenopathy.  Neurological: She is alert and oriented to person, place, and time.  Skin: Skin is warm and dry. Capillary refill takes less than 2 seconds.  Psychiatric: She has a normal mood and affect. Her behavior is normal.  Nursing note and vitals reviewed.    Musculoskeletal Exam: C-spine and thoracic lumbar spine good range of motion. Shoulder joints elbow joints wrist joint MCPs PIPs DIPs with good range of motion with no synovitis. Hip joints knee joints ankles MTPs PIPs DIPs with good range of motion with no synovitis.  CDAI Exam: CDAI Homunculus Exam:   Joint Counts:  CDAI Tender Joint count: 0 CDAI Swollen Joint count: 0  Global Assessments:  Patient Global Assessment: 1 Provider Global Assessment: 1  CDAI Calculated Score: 2    Investigation: No additional findings.TB Gold: Negative in 09/2016 CBC Latest Ref Rng & Units 10/01/2016  WBC 3.8 - 10.8 K/uL 4.7  Hemoglobin 11.7 - 15.5 g/dL 04.513.3  Hematocrit 40.935.0 - 45.0 % 39.3  Platelets 140 - 400 K/uL 165   CMP Latest Ref Rng & Units 10/01/2016  Glucose 65 - 99 mg/dL 811(B123(H)  BUN 7 - 25 mg/dL 13  Creatinine 1.470.50 - 8.291.05 mg/dL 5.620.96  Sodium 130135 - 865146 mmol/L 138  Potassium 3.5 - 5.3 mmol/L 3.9  Chloride 98 - 110 mmol/L 107  CO2 20 - 31 mmol/L 21  Calcium 8.6 - 10.4 mg/dL 8.9  Total Protein 6.1 - 8.1 g/dL 7.0  Total Bilirubin 0.2 - 1.2 mg/dL 0.6  Alkaline Phos 33 - 130 U/L 50  AST 10 - 35 U/L 18  ALT 6 - 29 U/L 14    Imaging: No results found.  Speciality Comments: No specialty comments available.    Procedures:  No procedures performed Allergies: Patient has no known allergies.   Assessment / Plan:     Visit Diagnoses: Rheumatoid arthritis involving multiple sites with positive rheumatoid factor (HCC) +CCP . Patient continues to do well on methotrexate and Enbrel combination. She had no synovitis on examination today. She has minimal morning stiffness in the morning.  High risk medication use - Enbrel  50 mg subcutaneous every week, MTX 3 tablets by mouth every week, folic acid 1 mg by mouth daily - Plan: CBC with Differential/Platelet, COMPLETE METABOLIC PANEL WITH GFR today and then every 3 months.  Sicca syndrome (HCC) - Restasis eyedrops have been helpful.  Right shoulder tendonitis - She does have some nocturnal pain in her shoulders. The symptoms have improved.  Primary osteoarthritis of both knees: She is off and on discomfort in her knee joint.  Primary osteoarthritis of both feet: Proper fitting shoes were discussed.  DDD (degenerative disc disease), lumbar: She is intermittent pain.  Essential hypertension: Patient was started on Bystolic recently by her PCP.  History of hyperlipidemia  History of kidney stones  History of cholecystectomy  History of obesity : Weight loss diet and exercise was discussed.  Association of heart disease with rheumatoid arthritis was discussed. Need to monitor blood pressure, cholesterol, and to exercise 30-60 minutes on daily basis was discussed. Poor dental hygiene can be a predisposing factor for rheumatoid arthritis. Good dental hygiene was discussed.  Orders:  Orders Placed This Encounter  Procedures  . CBC with Differential/Platelet  . COMPLETE METABOLIC PANEL WITH GFR   No orders of the defined types were placed in this encounter.   Face-to-face time spent with patient was 30 minutes. Greater than 50% of time was spent in counseling and coordination of care.  Follow-Up Instructions: Return in about 5 months (around 06/29/2017) for Rheumatoid arthritis, Osteoarthritis.   Pollyann Savoy, MD  Note - This record has been created using Animal nutritionist.  Chart creation errors have been sought, but may not always  have been located. Such creation errors do not reflect on  the standard of medical care.

## 2017-01-22 ENCOUNTER — Other Ambulatory Visit: Payer: Self-pay | Admitting: Rheumatology

## 2017-01-22 NOTE — Telephone Encounter (Signed)
Last Visit: 10/30/16 Next Visit: 01/29/17 Labs: 12/03/16 Creat 1.09 GFR 59 Previously normal  TB Gold: 10/01/16 Neg  Okay to refill per Dr. Corliss Skainseveshwar

## 2017-01-26 MED FILL — ENBREL 50 MG/ML SURECLICK S: 50 | 28 days supply | Qty: 4 | Fill #0

## 2017-01-29 ENCOUNTER — Encounter (INDEPENDENT_AMBULATORY_CARE_PROVIDER_SITE_OTHER): Payer: Self-pay

## 2017-01-29 ENCOUNTER — Ambulatory Visit (INDEPENDENT_AMBULATORY_CARE_PROVIDER_SITE_OTHER): Payer: BLUE CROSS/BLUE SHIELD | Admitting: Rheumatology

## 2017-01-29 ENCOUNTER — Encounter: Payer: Self-pay | Admitting: Rheumatology

## 2017-01-29 VITALS — BP 138/80 | HR 75 | Resp 17 | Ht 65.0 in | Wt 236.0 lb

## 2017-01-29 DIAGNOSIS — M19072 Primary osteoarthritis, left ankle and foot: Secondary | ICD-10-CM | POA: Diagnosis not present

## 2017-01-29 DIAGNOSIS — Z8639 Personal history of other endocrine, nutritional and metabolic disease: Secondary | ICD-10-CM

## 2017-01-29 DIAGNOSIS — M170 Bilateral primary osteoarthritis of knee: Secondary | ICD-10-CM | POA: Diagnosis not present

## 2017-01-29 DIAGNOSIS — M5136 Other intervertebral disc degeneration, lumbar region: Secondary | ICD-10-CM

## 2017-01-29 DIAGNOSIS — M778 Other enthesopathies, not elsewhere classified: Secondary | ICD-10-CM

## 2017-01-29 DIAGNOSIS — Z9049 Acquired absence of other specified parts of digestive tract: Secondary | ICD-10-CM

## 2017-01-29 DIAGNOSIS — M19071 Primary osteoarthritis, right ankle and foot: Secondary | ICD-10-CM | POA: Diagnosis not present

## 2017-01-29 DIAGNOSIS — M3500 Sicca syndrome, unspecified: Secondary | ICD-10-CM | POA: Diagnosis not present

## 2017-01-29 DIAGNOSIS — M0579 Rheumatoid arthritis with rheumatoid factor of multiple sites without organ or systems involvement: Secondary | ICD-10-CM | POA: Diagnosis not present

## 2017-01-29 DIAGNOSIS — Z87442 Personal history of urinary calculi: Secondary | ICD-10-CM | POA: Diagnosis not present

## 2017-01-29 DIAGNOSIS — M7581 Other shoulder lesions, right shoulder: Secondary | ICD-10-CM

## 2017-01-29 DIAGNOSIS — Z79899 Other long term (current) drug therapy: Secondary | ICD-10-CM

## 2017-01-29 LAB — CBC WITH DIFFERENTIAL/PLATELET
BASOS ABS: 22 {cells}/uL (ref 0–200)
Basophils Relative: 0.5 %
EOS ABS: 70 {cells}/uL (ref 15–500)
EOS PCT: 1.6 %
HCT: 39.5 % (ref 35.0–45.0)
HEMOGLOBIN: 13.8 g/dL (ref 11.7–15.5)
Lymphs Abs: 1760 cells/uL (ref 850–3900)
MCH: 32.4 pg (ref 27.0–33.0)
MCHC: 34.9 g/dL (ref 32.0–36.0)
MCV: 92.7 fL (ref 80.0–100.0)
MONOS PCT: 16.6 %
MPV: 12.6 fL — ABNORMAL HIGH (ref 7.5–12.5)
NEUTROS ABS: 1817 {cells}/uL (ref 1500–7800)
NEUTROS PCT: 41.3 %
Platelets: 145 10*3/uL (ref 140–400)
RBC: 4.26 10*6/uL (ref 3.80–5.10)
RDW: 13.9 % (ref 11.0–15.0)
Total Lymphocyte: 40 %
WBC mixed population: 730 cells/uL (ref 200–950)
WBC: 4.4 10*3/uL (ref 3.8–10.8)

## 2017-01-29 LAB — COMPLETE METABOLIC PANEL WITH GFR
AG Ratio: 1.2 (calc) (ref 1.0–2.5)
ALBUMIN MSPROF: 3.8 g/dL (ref 3.6–5.1)
ALKALINE PHOSPHATASE (APISO): 47 U/L (ref 33–130)
ALT: 19 U/L (ref 6–29)
AST: 19 U/L (ref 10–35)
BILIRUBIN TOTAL: 0.6 mg/dL (ref 0.2–1.2)
BUN: 17 mg/dL (ref 7–25)
CHLORIDE: 109 mmol/L (ref 98–110)
CO2: 22 mmol/L (ref 20–32)
Calcium: 9 mg/dL (ref 8.6–10.4)
Creat: 1 mg/dL (ref 0.50–1.05)
GFR, Est African American: 76 mL/min/{1.73_m2} (ref >=60)
GFR, Est Non African American: 65 mL/min/{1.73_m2} (ref >=60)
GLOBULIN: 3.3 g/dL (ref 1.9–3.7)
GLUCOSE: 103 mg/dL — AB (ref 65–99)
Potassium: 4.2 mmol/L (ref 3.5–5.3)
SODIUM: 140 mmol/L (ref 135–146)
Total Protein: 7.1 g/dL (ref 6.1–8.1)

## 2017-01-29 NOTE — Addendum Note (Signed)
Addended by: Henriette CombsHATTON, Jeferson Boozer L on: 01/29/2017 09:51 AM   Modules accepted: Orders

## 2017-01-29 NOTE — Patient Instructions (Signed)
Standing Labs We placed an order today for your standing lab work.    Please come back and get your standing labs in February and every 3 months  We have open lab Monday through Friday from 8:30-11:30 AM and 1:30-4 PM at the office of Dr. Pollyann SavoyShaili Story Vanvranken.   The office is located at [address], [address], [place], [place] [address] No appointment is necessary.   Labs are drawn by [facility].  You may receive a bill from [facility] for your lab work. If you have any questions regarding directions or hours of operation,  please call [phone].     Association of heart disease with rheumatoid arthritis was discussed. Need to monitor blood pressure, cholesterol, and to exercise 30-60 minutes on daily basis was discussed. Poor dental hygiene can be a predisposing factor for rheumatoid arthritis. Good dental hygiene was discussed.

## 2017-01-31 NOTE — Progress Notes (Signed)
Wnl

## 2017-02-22 MED FILL — ENBREL 50 MG/ML SURECLICK S: 50 | 28 days supply | Qty: 4 | Fill #1

## 2017-03-29 MED FILL — ENBREL 50 MG/ML SURECLICK S: 50 | 28 days supply | Qty: 4 | Fill #2

## 2017-04-23 ENCOUNTER — Other Ambulatory Visit: Payer: Self-pay | Admitting: Rheumatology

## 2017-04-23 NOTE — Telephone Encounter (Signed)
Last Visit: 01/29/17 Next Visit: 07/06/17 Labs: 01/29/17 WNL TB Gold: 10/01/16 Neg   Okay to refill per Dr. Corliss Skainseveshwar

## 2017-04-27 MED FILL — ENBREL 50 MG/ML SURECLICK S: 50 | 28 days supply | Qty: 4 | Fill #0

## 2017-05-31 MED FILL — ENBREL 50 MG/ML SURECLICK S: 50 | 28 days supply | Qty: 4 | Fill #1

## 2017-06-23 NOTE — Progress Notes (Signed)
Office Visit Note  Patient: Jessica Rice             Date of Birth: 12/24/1965           MRN: [id number]             PCP: Aldean Ast, PA-C Referring: Aldean Ast, PA-C Visit Date: 07/06/2017 Occupation: '@GUAROCC'$ @    Subjective:  Feet pain  History of Present Illness: Jessica Rice is a 52 y.o. female with history of seropositive rheumatoid arthritis and osteoarthritis.  She continues to inject Enbrel weekly, methotrexate 2 tablets weekly and folic acid 1 mg daily.  She would like to discontinue methotrexate.  She states that she has been taking methotrexate 2 tablets weekly for about 4-5 months.  She denies any flares of her rheumatoid arthritis.  She states that she has some increased joint pain in her bilateral hands and left elbow.  She states that she is also having stiffness in her hands and left elbow.  She states that she has some swelling in her hands especially in the morning.  She states she is also having some pain in her bilateral feet.  She denies any swelling in her feet.  She denies any wrist pain at this time. She continues to have intermittent pain in her lower back and right shoulder.  She reports good ROM of right shoulder.   She reports sicca symptoms.  She has been using restasis and continues to have significant eye dryness.  She also has dry mouth but has not been using OTC products.     Activities of Daily Living:  Patient reports morning stiffness for 10-15 minutes.   Patient Denies nocturnal pain.  Difficulty dressing/grooming: Denies Difficulty climbing stairs: Denies Difficulty getting out of chair: Denies Difficulty using hands for taps, buttons, cutlery, and/or writing: Reports   Review of Systems  Constitutional: Positive for fatigue.  HENT: Positive for mouth dryness. Negative for mouth sores and nose dryness.   Eyes: Positive for dryness. Negative for pain and visual disturbance.  Respiratory: Negative for cough, hemoptysis, shortness of  breath and difficulty breathing.   Cardiovascular: Negative for chest pain, palpitations, hypertension and swelling in legs/feet.  Gastrointestinal: Negative for blood in stool, constipation and diarrhea.  Endocrine: Negative for increased urination.  Genitourinary: Negative for painful urination.  Musculoskeletal: Positive for arthralgias, joint pain, joint swelling and morning stiffness. Negative for myalgias, muscle weakness, muscle tenderness and myalgias.  Skin: Positive for nodules/bumps. Negative for color change, pallor, rash, hair loss, skin tightness, ulcers and sensitivity to sunlight.  Allergic/Immunologic: Negative for susceptible to infections.  Neurological: Positive for headaches (Hx of tension headaches). Negative for dizziness, numbness and weakness.  Hematological: Negative for swollen glands.  Psychiatric/Behavioral: Negative for depressed mood and sleep disturbance. The patient is not nervous/anxious.     PMFS History:  Patient Active Problem List   Diagnosis Date Noted  . Rheumatoid arthritis involving multiple sites with positive rheumatoid factor ([place]) +CCP  09/28/2016  . High risk medication use 09/28/2016  . Right shoulder tendonitis 09/28/2016  . Plantar fasciitis 09/28/2016  . DDD (degenerative disc disease), lumbar 09/28/2016  . History of kidney stones 09/28/2016  . History of cholecystectomy 09/28/2016  . History of hyperlipidemia 09/28/2016  . Sicca syndrome ([place]) 09/28/2016    Past Medical History:  Diagnosis Date  . Dry eyes     Family History  Problem Relation Age of Onset  . Rheumatologic disease Mother   . Cancer Mother   .  Hypertension Father   . Heart disease Father   . Diabetes Brother    Past Surgical History:  Procedure Laterality Date  . GALLBLADDER SURGERY     Social History   Social History Narrative  . Not on file     Objective: Vital Signs: BP 120/88 (BP Location: Left Arm, Patient Position: Sitting, Cuff Size: Normal)    Pulse 76   Resp 16   Ht 5' 5"  (1.651 m)   Wt 232 lb (105.2 kg)   BMI 38.61 kg/m    Physical Exam  Constitutional: She is oriented to person, place, and time. She appears well-developed and well-nourished.  HENT:  Head: Normocephalic and atraumatic.  Eyes: Conjunctivae and EOM are normal.  Neck: Normal range of motion.  Cardiovascular: Normal rate, regular rhythm, normal heart sounds and intact distal pulses.  Pulmonary/Chest: Effort normal and breath sounds normal.  Abdominal: Soft. Bowel sounds are normal.  Lymphadenopathy:    She has no cervical adenopathy.  Neurological: She is alert and oriented to person, place, and time.  Skin: Skin is warm and dry. Capillary refill takes less than 2 seconds.  Psychiatric: She has a normal mood and affect. Her behavior is normal.  Nursing note and vitals reviewed.    Musculoskeletal Exam: C-spine, thoracic spine, lumbar spine good range of motion.  No midline spinal tenderness.  No SI joint tenderness.  Shoulder joints, elbow joints, wrist joints, MCPs, PIPs, DIPs good range of motion.  She has synovitis of her left 3rd PIP joint and fourth and fifth PIP joints.  No tenderness or synovitis of her MCP joints.  Hip joints, knee joints, ankle joints, MTPs, PIPs, DIPs good range of motion.  No warmth or effusion of bilateral knees.  She has tenderness of her right third MTP joint. No tenderness of trochanteric bursa.   CDAI Exam: CDAI Homunculus Exam:   Tenderness:  Right foot: 3rd MTP  Swelling:  Right hand: 4th PIP and 5th PIP Left hand: 3rd PIP Right foot: 3rd MTP  Joint Counts:  CDAI Tender Joint count: 0 CDAI Swollen Joint count: 3  Global Assessments:  Patient Global Assessment: 1 Provider Global Assessment: 1  CDAI Calculated Score: 5    Investigation: No additional findings. Labs: 05/20/2017 creat 1.09 previously 1.00 TB Gold: 10/01/2016 Negative  Imaging: No results found.  Speciality Comments: No specialty comments  available.    Procedures:  No procedures performed Allergies: Patient has no known allergies.   Assessment / Plan:     Visit Diagnoses: Rheumatoid arthritis involving multiple sites with positive rheumatoid factor (HCC) +CCP: She has synovitis of her right fourth and fifth PIP joint and left third PIP joint.  No tenderness or synovitis of MCPs.  She has tenderness of right 3rd MTP joint. She has been on injecting Enbrel weekly, MTX 2 tablets weekly, and folic acid 1 mg daily.  She has been on methotrexate 2 tablets weekly for about 4-5 months.  She denies any rheumatoid arthritis flares.  She has been having increased pain in her bilateral hands with stiffness and swelling.  She would like to discontinue methotrexate and folic acid at this time.  We will start her on Plaquenil 200 mg twice daily Monday through Friday.  We discussed the indications, complications, and side effects of Plaquenil.  Consent was obtained. All questions were addressed. She was given an eye exam form to take with her and she is going to schedule an appoint with her ophthalmologist.  Patient was counseled on  the purpose, proper use, and adverse effects of hydroxychloroquine including nausea/diarrhea, skin rash, headaches, and sun sensitivity.  Discussed importance of annual eye exams while on hydroxychloroquine to monitor to ocular toxicity and discussed importance of frequent laboratory monitoring.  Provided patient with eye exam form for baseline ophthalmologic exam.  Provided patient with educational materials on hydroxychloroquine and answered all questions.  Patient consented to hydroxychloroquine.  Will upload consent in the media tab.  Will call in prescription for Plaquenil 200 mg p.o. twice daily Monday through Friday total 30-day supply with 2 refills will be given.  High risk medication use - Enbrel and PLQ.  She is going to discontinue methotrexate and folic acid.  She will be started on Plaquenil.  She was given a  lab printout for CBC and CMP which were performed in May and every 3 months.  TB gold is due in July 2019.- Plan: CMP14+EGFR, CBC with Differential/Platelet  Sicca syndrome ([place]) -She continues to have significant eye dryness.  She uses Restasis on a daily basis.  She is also having mouth dryness but has not tried over-the-counter products that.  I encouraged her to try Biotene products over-the-counter.    Primary osteoarthritis of both knees: No warmth or effusion on exam.  No discomfort in her bilateral knees at this time.   Primary osteoarthritis of both feet: She has PIP and DIP synovial thickening consistent with osteoarthritis in her feet.  Proper fitting shoes were discussed.  DDD (degenerative disc disease), lumbar: She has intermittent discomfort in her lower back.  No midline spinal tenderness.  Right shoulder tendonitis: Good range of motion with no discomfort.  Other medical conditions are listed as follows:  History of hypertension  History of obesity  History of hyperlipidemia  History of kidney stones  History of cholecystectomy    Orders: No orders of the defined types were placed in this encounter.  No orders of the defined types were placed in this encounter.   Face-to-face time spent with patient was 30 minutes. >50% of time was spent in counseling and coordination of care.  Follow-Up Instructions: Return in about 3 months (around 10/05/2017) for Rheumatoid arthritis, Osteoarthritis, DDD.   Ofilia Neas, PA-C   I examined and evaluated the patient with Hazel Sams PA.  Patient was to discontinue methotrexate as she is concerned about drinking alcohol along with methotrexate.  She still have some synovitis on examination as described above.  I detailed discussion regarding different treatment options and their side effects.  She was willing to proceed with Plaquenil.  A handout was given and consent was taken.  Prescription for Plaquenil was also called in.   We will see her back in 3 months.  If she has an adequate response to the combination therapy we may have to switch her Biologics.  The plan of care was discussed as noted above.  Bo Merino, MD  Note - This record has been created using Editor, commissioning.  Chart creation errors have been sought, but may not always  have been located. Such creation errors do not reflect on  the standard of medical care.

## 2017-06-29 MED FILL — ENBREL 50 MG/ML SURECLICK S: 50 | 28 days supply | Qty: 4 | Fill #2

## 2017-07-06 ENCOUNTER — Ambulatory Visit: Payer: BLUE CROSS/BLUE SHIELD | Admitting: Rheumatology

## 2017-07-06 ENCOUNTER — Encounter: Payer: Self-pay | Admitting: Rheumatology

## 2017-07-06 VITALS — BP 120/88 | HR 76 | Resp 16 | Ht 65.0 in | Wt 232.0 lb

## 2017-07-06 DIAGNOSIS — M5136 Other intervertebral disc degeneration, lumbar region: Secondary | ICD-10-CM | POA: Diagnosis not present

## 2017-07-06 DIAGNOSIS — Z8639 Personal history of other endocrine, nutritional and metabolic disease: Secondary | ICD-10-CM

## 2017-07-06 DIAGNOSIS — M0579 Rheumatoid arthritis with rheumatoid factor of multiple sites without organ or systems involvement: Secondary | ICD-10-CM

## 2017-07-06 DIAGNOSIS — M51369 Other intervertebral disc degeneration, lumbar region without mention of lumbar back pain or lower extremity pain: Secondary | ICD-10-CM

## 2017-07-06 DIAGNOSIS — M19072 Primary osteoarthritis, left ankle and foot: Secondary | ICD-10-CM

## 2017-07-06 DIAGNOSIS — M3500 Sicca syndrome, unspecified: Secondary | ICD-10-CM

## 2017-07-06 DIAGNOSIS — Z9049 Acquired absence of other specified parts of digestive tract: Secondary | ICD-10-CM | POA: Diagnosis not present

## 2017-07-06 DIAGNOSIS — M170 Bilateral primary osteoarthritis of knee: Secondary | ICD-10-CM

## 2017-07-06 DIAGNOSIS — Z87442 Personal history of urinary calculi: Secondary | ICD-10-CM | POA: Diagnosis not present

## 2017-07-06 DIAGNOSIS — M7581 Other shoulder lesions, right shoulder: Secondary | ICD-10-CM

## 2017-07-06 DIAGNOSIS — M19071 Primary osteoarthritis, right ankle and foot: Secondary | ICD-10-CM

## 2017-07-06 DIAGNOSIS — Z79899 Other long term (current) drug therapy: Secondary | ICD-10-CM | POA: Diagnosis not present

## 2017-07-06 DIAGNOSIS — Z8679 Personal history of other diseases of the circulatory system: Secondary | ICD-10-CM

## 2017-07-06 DIAGNOSIS — M778 Other enthesopathies, not elsewhere classified: Secondary | ICD-10-CM

## 2017-07-06 MED ORDER — HYDROXYCHLOROQUINE SULFATE 200 MG PO TABS: ORAL_TABLET | ORAL | 2 refills | Status: DC

## 2017-07-06 NOTE — Patient Instructions (Addendum)
Standing Labs We placed an order today for your standing lab work.    Please come back and get your standing labs in May and every 3 months   We have open lab Monday through Friday from 8:30-11:30 AM and 1:30-4:00 PM  at the office of Dr. Pollyann SavoyShaili Deveshwar.   You may experience shorter wait times on Monday and Friday afternoons. The office is located at [address], [address], [place], [place] [address] No appointment is necessary.   Labs are drawn by [facility].  You may receive a bill from [facility] for your lab work. If you have any questions regarding directions or hours of operation,  please call [phone].     Hydroxychloroquine tablets What is this medicine? HYDROXYCHLOROQUINE (hye drox ee KLOR oh kwin) is used to treat rheumatoid arthritis and systemic lupus erythematosus. It is also used to treat malaria. This medicine may be used for other purposes; ask your health care provider or pharmacist if you have questions. COMMON BRAND NAME(S): Plaquenil, Quineprox What should I tell my health care provider before I take this medicine? They need to know if you have any of these conditions: -diabetes -eye disease, vision problems -G6PD deficiency -history of blood diseases -history of irregular heartbeat -if you often drink alcohol -kidney disease -liver disease -porphyria -psoriasis -seizures -an unusual or allergic reaction to chloroquine, hydroxychloroquine, other medicines, foods, dyes, or preservatives -pregnant or trying to get pregnant -breast-feeding How should I use this medicine? Take this medicine by mouth with a glass of water. Follow the directions on the prescription label. Avoid taking antacids within 4 hours of taking this medicine. It is best to separate these medicines by at least 4 hours. Do not cut, crush or chew this medicine. You can take it with or without food. If it upsets your stomach, take it with food. Take your medicine at regular intervals. Do not  take your medicine more often than directed. Take all of your medicine as directed even if you think you are better. Do not skip doses or stop your medicine early. Talk to your pediatrician regarding the use of this medicine in children. While this drug may be prescribed for selected conditions, precautions do apply. Overdosage: If you think you have taken too much of this medicine contact a poison control center or emergency room at once. NOTE: This medicine is only for you. Do not share this medicine with others. What if I miss a dose? If you miss a dose, take it as soon as you can. If it is almost time for your next dose, take only that dose. Do not take double or extra doses. What may interact with this medicine? Do not take this medicine with any of the following medications: -cisapride -dofetilide -dronedarone -live virus vaccines -penicillamine -pimozide -thioridazine -ziprasidone This medicine may also interact with the following medications: -ampicillin -antacids -cimetidine -cyclosporine -digoxin -medicines for diabetes, like insulin, glipizide, glyburide -medicines for seizures like carbamazepine, phenobarbital, phenytoin -mefloquine -methotrexate -other medicines that prolong the QT interval (cause an abnormal heart rhythm) -praziquantel This list may not describe all possible interactions. Give your health care provider a list of all the medicines, herbs, non-prescription drugs, or dietary supplements you use. Also tell them if you smoke, drink alcohol, or use illegal drugs. Some items may interact with your medicine. What should I watch for while using this medicine? Tell your doctor or healthcare professional if your symptoms do not start to get better or if they get worse. Avoid taking  antacids within 4 hours of taking this medicine. It is best to separate these medicines by at least 4 hours. Tell your doctor or health care professional right away if you have any  change in your eyesight. Your vision and blood may be tested before and during use of this medicine. This medicine can make you more sensitive to the sun. Keep out of the sun. If you cannot avoid being in the sun, wear protective clothing and use sunscreen. Do not use sun lamps or tanning beds/booths. What side effects may I notice from receiving this medicine? Side effects that you should report to your doctor or health care professional as soon as possible: -allergic reactions like skin rash, itching or hives, swelling of the face, lips, or tongue -changes in vision -decreased hearing or ringing of the ears -redness, blistering, peeling or loosening of the skin, including inside the mouth -seizures -sensitivity to light -signs and symptoms of a dangerous change in heartbeat or heart rhythm like chest pain; dizziness; fast or irregular heartbeat; palpitations; feeling faint or lightheaded, falls; breathing problems -signs and symptoms of liver injury like dark yellow or brown urine; general ill feeling or flu-like symptoms; light-colored stools; loss of appetite; nausea; right upper belly pain; unusually weak or tired; yellowing of the eyes or skin -signs and symptoms of low blood sugar such as feeling anxious; confusion; dizziness; increased hunger; unusually weak or tired; sweating; shakiness; cold; irritable; headache; blurred vision; fast heartbeat; loss of consciousness -uncontrollable head, mouth, neck, arm, or leg movements Side effects that usually do not require medical attention (report to your doctor or health care professional if they continue or are bothersome): -anxious -diarrhea -dizziness -hair loss -headache -irritable -loss of appetite -nausea, vomiting -stomach pain This list may not describe all possible side effects. Call your doctor for medical advice about side effects. You may report side effects to FDA at 1-800-FDA-1088. Where should I keep my medicine? Keep out  of the reach of children. In children, this medicine can cause overdose with small doses. Store at room temperature between 15 and 30 degrees C (59 and 86 degrees F). Protect from moisture and light. Throw away any unused medicine after the expiration date. NOTE: This sheet is a summary. It may not cover all possible information. If you have questions about this medicine, talk to your doctor, pharmacist, or health care provider.  2018 Elsevier/Gold Standard (2015-10-30 14:16:15)

## 2017-07-22 ENCOUNTER — Other Ambulatory Visit: Payer: Self-pay | Admitting: Rheumatology

## 2017-07-22 NOTE — Telephone Encounter (Signed)
Last Visit: 07/06/17 Next Visit: 10/13/17 Labs: 05/20/17 Creat 1.09 previously 1.00 TB Gold: 10/01/16  Okay to refill per Dr. Corliss Skainseveshwar

## 2017-07-28 MED FILL — ENBREL 50 MG/ML SURECLICK S: 50 | 28 days supply | Qty: 4 | Fill #0

## 2017-07-29 ENCOUNTER — Encounter: Payer: Self-pay | Admitting: Rheumatology

## 2017-07-29 NOTE — Telephone Encounter (Signed)
Patient reports increased pain and swelling.  We will need to evaluate her before which changing her medications.

## 2017-07-29 NOTE — Telephone Encounter (Signed)
Attempted to contact the patient and left message for patient to call the office.  

## 2017-08-04 NOTE — Progress Notes (Signed)
Office Visit Note  Patient: Jessica Rice             Date of Birth: 28-Sep-1965           MRN: [id number]             PCP: Marrian Salvage, PA-C Referring: Marrian Salvage, PA-C Visit Date: 08/11/2017 Occupation: @    Subjective:  Pain in multiple joints    History of Present Illness: Uldine VIOLET SEABURY is a 52 y.o. female with history of seropositive rheumatoid arthritis, osteoarthritis, DDD, and sicca syndrome.  Patient has been injecting Enbrel every week and taking Plaquenil 200 mg twice daily Monday through Friday.  She was started on Plaquenil on 07/06/2017.  She has not noticed any benefit since starting impact.  She denies any side effects since starting on Plaquenil.  She states her last dose of Enbrel was on Sunday.  She states that she is having a rheumatoid arthritis flare.  She is having pain in multiple joints including her bilateral hands bilateral feet bilateral ankles bilateral shoulders bilateral elbows and bilateral knees.  She states she is having swelling in her hands, wrists, and elbows.  She states the pain is waking her up at night.  She states she is also noticed some swelling in her right knee especially if she standing for prolonged periods of time.  She states that her hand stiffness is lasting all day.  She reports that she previously was on Humira as well as methotrexate but had an inadequate response.    Activities of Daily Living:  Patient reports morning stiffness for  all day.   Patient Reports nocturnal pain.  Difficulty dressing/grooming: Denies Difficulty climbing stairs: Reports Difficulty getting out of chair: Reports Difficulty using hands for taps, buttons, cutlery, and/or writing: Reports   Review of Systems  Constitutional: Positive for fatigue.  HENT: Positive for mouth dryness. Negative for mouth sores and nose dryness.   Eyes: Positive for dryness. Negative for pain and visual disturbance.  Respiratory: Negative for cough, hemoptysis,  shortness of breath and difficulty breathing.   Cardiovascular: Negative for chest pain, palpitations, hypertension and swelling in legs/feet.  Gastrointestinal: Negative for blood in stool, constipation and diarrhea.  Endocrine: Negative for increased urination.  Genitourinary: Negative for painful urination.  Musculoskeletal: Positive for arthralgias, joint pain, joint swelling and morning stiffness. Negative for myalgias, muscle weakness, muscle tenderness and myalgias.  Skin: Positive for nodules/bumps. Negative for color change, pallor, rash, hair loss, skin tightness, ulcers and sensitivity to sunlight.  Allergic/Immunologic: Negative for susceptible to infections.  Neurological: Negative for dizziness, numbness, headaches and weakness.  Hematological: Negative for swollen glands.  Psychiatric/Behavioral: Positive for depressed mood. Negative for sleep disturbance. The patient is not nervous/anxious.     PMFS History:  Patient Active Problem List   Diagnosis Date Noted  . Rheumatoid arthritis involving multiple sites with positive rheumatoid factor (HCC) +CCP  09/28/2016  . High risk medication use 09/28/2016  . Right shoulder tendonitis 09/28/2016  . Plantar fasciitis 09/28/2016  . DDD (degenerative disc disease), lumbar 09/28/2016  . History of kidney stones 09/28/2016  . History of cholecystectomy 09/28/2016  . History of hyperlipidemia 09/28/2016  . Sicca syndrome (HCC) 09/28/2016    Past Medical History:  Diagnosis Date  . Dry eyes     Family History  Problem Relation Age of Onset  . Rheumatologic disease Mother   . Cancer Mother   . Hypertension Father   . Heart disease Father   .  Diabetes Brother    Past Surgical History:  Procedure Laterality Date  . GALLBLADDER SURGERY     Social History   Social History Narrative  . Not on file     Objective: Vital Signs: BP 120/68 (BP Location: Left Arm, Patient Position: Sitting, Cuff Size: Normal)   Pulse 77    Resp 16   Ht  (1.651 m)   Wt 227 lb (103 kg)   BMI 37.77 kg/m    Physical Exam  Constitutional: She is oriented to person, place, and time. She appears well-developed and well-nourished.  HENT:  Head: Normocephalic and atraumatic.  Eyes: Conjunctivae and EOM are normal.  Neck: Normal range of motion.  Cardiovascular: Normal rate, regular rhythm, normal heart sounds and intact distal pulses.  Pulmonary/Chest: Effort normal and breath sounds normal.  Abdominal: Soft. Bowel sounds are normal.  Lymphadenopathy:    She has no cervical adenopathy.  Neurological: She is alert and oriented to person, place, and time.  Skin: Skin is warm and dry. Capillary refill takes less than 2 seconds.  Psychiatric: She has a normal mood and affect. Her behavior is normal.  Nursing note and vitals reviewed.    Musculoskeletal Exam: C-spine limited ROM with discomfort.  Thoracic and lumbar spine good ROM.  No midline spinal tenderness.  No SI joint tenderness.  Shoulder joints good ROM.  She has mild left elbow contracture.  She has synovitis of bilateral elbow joints.  She has tenderness of bilateral wrist joints.  She has 80% fist formation bilaterally.  She has synovitis of of several PIP and MCP joints as described below.  Hip joints good ROM.  No tenderness of trochanteric bursa.  She has tenderness and mild warmth of bilateral knee joints.  She has synovitis and tenderness of bilateral ankle joints. She has tenderness of all MTPs.   CDAI Exam: CDAI Homunculus Exam:   Tenderness:  RUE: ulnohumeral and radiohumeral and wrist LUE: ulnohumeral and radiohumeral and wrist Right hand: 2nd MCP, 3rd MCP, 4th MCP and 5th MCP Left hand: 2nd MCP, 3rd MCP, 4th MCP and 5th MCP RLE: tibiofemoral and tibiotalar LLE: tibiotalar Right foot: 1st MTP, 2nd MTP, 3rd MTP, 4th MTP and 5th MTP Left foot: 1st MTP, 2nd MTP, 3rd MTP, 4th MTP and 5th MTP  Swelling:  RUE: ulnohumeral and radiohumeral LUE: ulnohumeral  and radiohumeral Right hand: 2nd MCP, 3rd MCP, 3rd PIP and 4th PIP Left hand: 1st PIP, 2nd PIP, 3rd PIP and 4th PIP RLE: tibiotalar LLE: tibiotalar  Joint Counts:  CDAI Tender Joint count: 13 CDAI Swollen Joint count: 10  Global Assessments:  Patient Global Assessment: 7 Provider Global Assessment: 7  CDAI Calculated Score: 37    Investigation: No additional findings. CBC Latest Ref Rng & Units 01/29/2017 10/01/2016  WBC 3.8 - 10.8 Thousand/uL 4.4 4.7  Hemoglobin 11.7 - 15.5 g/dL 16.1 09.6  Hematocrit 04.5 - 45.0 % 39.5 39.3  Platelets 140 - 400 Thousand/uL 145 165   CMP Latest Ref Rng & Units 01/29/2017 10/01/2016  Glucose 65 - 99 mg/dL 409(W) 119(J)  BUN 7 - 25 mg/dL 17 13  Creatinine 4.78 - 1.05 mg/dL 2.95 6.21  Sodium 308 - 146 mmol/L 140 138  Potassium 3.5 - 5.3 mmol/L 4.2 3.9  Chloride 98 - 110 mmol/L 109 107  CO2 20 - 32 mmol/L 22 21  Calcium 8.6 - 10.4 mg/dL 9.0 8.9  Total Protein 6.1 - 8.1 g/dL 7.1 7.0  Total Bilirubin 0.2 - 1.2 mg/dL 0.6  0.6  Alkaline Phos 33 - 130 U/L - 50  AST 10 - 35 U/L 19 18  ALT 6 - 29 U/L 19 14    Imaging: No results found.  Speciality Comments: PLQ Eye Exam: 07/28/17 WNL @ [facility] up in 6 months    Procedures:  No procedures performed Allergies: Patient has no known allergies.   Assessment / Plan:     Visit Diagnoses: Rheumatoid arthritis involving multiple sites with positive rheumatoid factor (HCC) +CCP: She has active synovitis of several PIPs, MCPs, elbow joints, and ankle joints.  She has been flaring for several weeks.  She is having increased pain and swelling in multiple joints.  She is currently injecting Enbrel once weekly and taking PLQ 200 mg BID M-F.  She was started on PLQ 07/06/17 and has not noticed any improvement.  She had an inadequate response to Humira in the past.  She would not like to restart on MTX due to the risk of elevated LFTs since she drinks alcohol.  We will apply for Orenica subcutaneous  injections.  Indications, contraindications, side effects were discussed.  Consent was obtained. All questions were addressed. She was advised to skip her next dose of Enbrel.  We are going to send in a prednisone taper starting at 20 mg tapering by 5 mg every 4 days.  She was advised to avoid taking NSAIDs while on prednisone.  Potential side effects of prednisone were discussed.  Medication counseling:  TB Gold: Negative 10/01/2016 Hepatitis panel:  HIV: Nonreactive 10/01/2016 SPEP: results were consistent with acute phase reaction on 10/01/16 Immunoglobulins: 10/01/16  Does patient have a diagnosis of COPD? No  Counseled patient that Dub Amis is a selective T-cell costimulation blocker indicated for rheumatoid arthritis.  Counseled patient on purpose, proper use, and adverse effects of Orencia. The most common adverse effects are increased risk of infections, headache, and injection site reactions.  There is the possibility of an increased risk of malignancy but it is not well understood if this increased risk is due to the medication or the disease state.  Reviewed the importance of regular labs while on Orencia therapy.  Counseled patient that Dub Amis should be held prior to scheduled surgery.  Counseled patient to avoid live vaccines while on Orencia.  Advised patient to get annual influenza vaccine and the pneumococcal vaccine as indicated.  Provided patient with medication education material and answered all questions.  Patient consented to [facility].  Will upload consent into patient's chart.  Will apply for Orencia through patient's insurance.  Reviewed storage information for Orencia.  Advised initial injection must be administered in office.    High risk medication use - She will be started on Orenica.  Discontinuing Enbrel.  She will continue on PLQ 200 mg BID M-F. TB gold: 10/01/16.  CBC, CMP, TB gold were drawn today.- Plan: QuantiFERON-TB Gold Plus, CBC with Differential/Platelet, COMPLETE METABOLIC  PANEL WITH GFR  Sicca syndrome (HCC): She continues to have chronic dry mouth and dry eyes.  Right shoulder tendonitis: She has good range of motion on exam but she has discomfort in bilateral shoulders.  DDD (degenerative disc disease), lumbar: Chronic pain.  No midline spinal tenderness.    Plantar fasciitis - Resolved  Primary osteoarthritis of both knees: She is having increased pain in bilateral knees.  She has mild warmth of bilateral knee joints.   Other medical conditions are listed as follows  Essential hypertension  History of hyperlipidemia  History of kidney stones  Orders: Orders Placed This Encounter  Procedures  . QuantiFERON-TB Gold Plus  . CBC with Differential/Platelet  . COMPLETE METABOLIC PANEL WITH GFR   Meds ordered this encounter  Medications  . predniSONE (DELTASONE) 5 MG tablet    Sig: Take 4 tablets by mouth for 4 days, 3 tablets for 4 days, 2 tablets for 4 days, 1 tablet for 4 days.    Dispense:  40 tablet    Refill:  0    Face-to-face time spent with patient was 30 minutes. >50% of time was spent in counseling and coordination of care.  Follow-Up Instructions: Return in about 3 months (around 11/11/2017) for Rheumatoid arthritis, Osteoarthritis, DDD.   Gearldine Bienenstock, PA-C  Note - This record has been created using Dragon software.  Chart creation errors have been sought, but may not always  have been located. Such creation errors do not reflect on  the standard of medical care.

## 2017-08-11 ENCOUNTER — Telehealth: Payer: Self-pay

## 2017-08-11 ENCOUNTER — Encounter: Payer: Self-pay | Admitting: Physician Assistant

## 2017-08-11 ENCOUNTER — Ambulatory Visit: Payer: BLUE CROSS/BLUE SHIELD | Admitting: Physician Assistant

## 2017-08-11 VITALS — BP 120/68 | HR 77 | Resp 16 | Ht 65.0 in | Wt 227.0 lb

## 2017-08-11 DIAGNOSIS — M722 Plantar fascial fibromatosis: Secondary | ICD-10-CM

## 2017-08-11 DIAGNOSIS — Z8639 Personal history of other endocrine, nutritional and metabolic disease: Secondary | ICD-10-CM

## 2017-08-11 DIAGNOSIS — Z87442 Personal history of urinary calculi: Secondary | ICD-10-CM

## 2017-08-11 DIAGNOSIS — I10 Essential (primary) hypertension: Secondary | ICD-10-CM

## 2017-08-11 DIAGNOSIS — Z79899 Other long term (current) drug therapy: Secondary | ICD-10-CM

## 2017-08-11 DIAGNOSIS — M3500 Sicca syndrome, unspecified: Secondary | ICD-10-CM

## 2017-08-11 DIAGNOSIS — M170 Bilateral primary osteoarthritis of knee: Secondary | ICD-10-CM

## 2017-08-11 DIAGNOSIS — M7581 Other shoulder lesions, right shoulder: Secondary | ICD-10-CM | POA: Diagnosis not present

## 2017-08-11 DIAGNOSIS — M778 Other enthesopathies, not elsewhere classified: Secondary | ICD-10-CM

## 2017-08-11 DIAGNOSIS — M5136 Other intervertebral disc degeneration, lumbar region: Secondary | ICD-10-CM | POA: Diagnosis not present

## 2017-08-11 DIAGNOSIS — M0579 Rheumatoid arthritis with rheumatoid factor of multiple sites without organ or systems involvement: Secondary | ICD-10-CM | POA: Diagnosis not present

## 2017-08-11 MED ORDER — PREDNISONE 5 MG PO TABS: ORAL_TABLET | ORAL | 0 refills | Status: DC

## 2017-08-11 NOTE — Patient Instructions (Signed)
Abatacept solution for injection (subcutaneous or intravenous use)  What is this medicine?  ABATACEPT (a ba TA sept) is used to treat moderate to severe active rheumatoid arthritis or psoriatic arthritis in adults. This medicine is also used to treat juvenile idiopathic arthritis.  This medicine may be used for other purposes; ask your health care provider or pharmacist if you have questions.  COMMON BRAND NAME(S): Orencia  What should I tell my health care provider before I take this medicine?  They need to know if you have any of these conditions:  -are taking other medicines to treat rheumatoid arthritis  -COPD  -diabetes  -infection or history of infections  -recently received or scheduled to receive a vaccine  -scheduled to have surgery  -tuberculosis, a positive skin test for tuberculosis or have recently been in close contact with someone who has tuberculosis  -viral hepatitis  -an unusual or allergic reaction to abatacept, other medicines, foods, dyes, or preservatives  -pregnant or trying to get pregnant  -breast-feeding  How should I use this medicine?  This medicine is for infusion into a vein or for injection under the skin. Infusions are given by a health care professional in a hospital or clinic setting. If you are to give your own medicine at home, you will be taught how to prepare and give this medicine under the skin. Use exactly as directed. Take your medicine at regular intervals. Do not take your medicine more often than directed.  It is important that you put your used needles and syringes in a special sharps container. Do not put them in a trash can. If you do not have a sharps container, call your pharmacist or healthcare provider to get one.  Talk to your pediatrician regarding the use of this medicine in children. While infusions in a clinic may be prescribed for children as young as 2 years for selected conditions, precautions do apply.  Overdosage: If you think you have taken too much of  this medicine contact a poison control center or emergency room at once.  NOTE: This medicine is only for you. Do not share this medicine with others.  What if I miss a dose?  This medicine is used once a week if given by injection under the skin. If you miss a dose, take it as soon as you can. If it is almost time for your next dose, take only that dose. Do not take double or extra doses.  If you are to be given an infusion, it is important not to miss your dose. Doses are usually every 4 weeks. Call your doctor or health care professional if you are unable to keep an appointment.  What may interact with this medicine?  Do not take this medicine with any of the following medications:  -adalimumab  -anakinra  -certolizumab  -etanercept  -golimumab  -infliximab  -live virus vaccines  -rituximab  -tocilizumab  This medicine may also interact with the following medications:  -vaccines  This list may not describe all possible interactions. Give your health care provider a list of all the medicines, herbs, non-prescription drugs, or dietary supplements you use. Also tell them if you smoke, drink alcohol, or use illegal drugs. Some items may interact with your medicine.  What should I watch for while using this medicine?  Visit your doctor for regular check ups while you are taking this medicine. Tell your doctor or healthcare professional if your symptoms do not start to get better or if they   get worse.  Call your doctor or health care professional if you get a cold or other infection while receiving this medicine. Do not treat yourself. This medicine may decrease your body's ability to fight infection. Try to avoid being around people who are sick.  What side effects may I notice from receiving this medicine?  Side effects that you should report to your doctor or health care professional as soon as possible:  -allergic reactions like skin rash, itching or hives, swelling of the face, lips, or tongue  -breathing  problems  -chest pain  -signs of infection - fever or chills, cough, unusual tiredness, pain or trouble passing urine, or warm, red or painful skin  Side effects that usually do not require medical attention (report to your doctor or health care professional if they continue or are bothersome):  -dizziness  -headache  -nausea, vomiting  -sore throat  -stomach upset  This list may not describe all possible side effects. Call your doctor for medical advice about side effects. You may report side effects to FDA at 1-800-FDA-1088.  Where should I keep my medicine?  Infusions will be given in a hospital or clinic and will not be stored at home.  Storage for syringes given under the skin and stored at home:  Keep out of the reach of children. Store in a refrigerator between 2 and 8 degrees C (36 and 46 degrees F). Keep this medicine in the original container. Protect from light. Do not freeze. Throw away any unused medicine after the expiration date.  NOTE: This sheet is a summary. It may not cover all possible information. If you have questions about this medicine, talk to your doctor, pharmacist, or health care provider.   2018 Elsevier/Gold Standard (2015-10-03 10:07:35)

## 2017-08-11 NOTE — Telephone Encounter (Signed)
Was asked to submit a prior authorization for Orencia SQ for pt. Authorization was submitted via cover my meds. Medication has been approved. Informed during office visit. Gave her co-pay information to use at the pharmacy. We will send her Rx to the pharmacy and have the first refill shipped to the office. Once received, we will schedule nursing visit.   Patient voices understanding. 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 She would like to start Orencia the last week in May. Please send new RX for Orencia to [facility] for pt. Thanks!  Walta [place], [place], CPhT 4:58 PM

## 2017-08-12 MED ORDER — ABATACEPT 125 MG/ML ~~LOC~~ SOAJ: 125.0000 mg | SUBCUTANEOUS | 0 refills | Status: DC

## 2017-08-12 NOTE — Progress Notes (Signed)
Glucose is 124.  Creatinine is elevated and GFR is mildly low.  Please advise her to avoid NSAIDs.  We will continue to monitor.

## 2017-08-12 NOTE — Telephone Encounter (Signed)
Prescription sent to the pharmacy and will schedule nurse visit once medication is received in the office.  

## 2017-08-13 MED FILL — ORENCIA CLICKJECT 125 MG/ML: 125 | 28 days supply | Qty: 4 | Fill #0

## 2017-08-14 ENCOUNTER — Encounter: Payer: Self-pay | Admitting: Rheumatology

## 2017-08-14 LAB — CBC WITH DIFFERENTIAL/PLATELET
Basophils Absolute: 22 cells/uL (ref 0–200)
Basophils Relative: 0.5 %
EOS PCT: 2.1 %
Eosinophils Absolute: 92 cells/uL (ref 15–500)
HCT: 41.8 % (ref 35.0–45.0)
Hemoglobin: 14.7 g/dL (ref 11.7–15.5)
Lymphs Abs: 2028 cells/uL (ref 850–3900)
MCH: 31.5 pg (ref 27.0–33.0)
MCHC: 35.2 g/dL (ref 32.0–36.0)
MCV: 89.7 fL (ref 80.0–100.0)
MONOS PCT: 14.2 %
MPV: 12.7 fL — AB (ref 7.5–12.5)
NEUTROS PCT: 37.1 %
Neutro Abs: 1632 cells/uL (ref 1500–7800)
PLATELETS: 157 10*3/uL (ref 140–400)
RBC: 4.66 10*6/uL (ref 3.80–5.10)
RDW: 13 % (ref 11.0–15.0)
TOTAL LYMPHOCYTE: 46.1 %
WBC mixed population: 625 cells/uL (ref 200–950)
WBC: 4.4 10*3/uL (ref 3.8–10.8)

## 2017-08-14 LAB — COMPLETE METABOLIC PANEL WITH GFR
AG RATIO: 1 (calc) (ref 1.0–2.5)
ALT: 15 U/L (ref 6–29)
AST: 24 U/L (ref 10–35)
Albumin: 3.9 g/dL (ref 3.6–5.1)
Alkaline phosphatase (APISO): 57 U/L (ref 33–130)
BUN/Creatinine Ratio: 17 (calc) (ref 6–22)
BUN: 19 mg/dL (ref 7–25)
CALCIUM: 9.7 mg/dL (ref 8.6–10.4)
CO2: 25 mmol/L (ref 20–32)
Chloride: 107 mmol/L (ref 98–110)
Creat: 1.14 mg/dL — ABNORMAL HIGH (ref 0.50–1.05)
GFR, EST NON AFRICAN AMERICAN: 56 mL/min/{1.73_m2} — AB (ref >=60)
GFR, Est African American: 64 mL/min/{1.73_m2} (ref >=60)
GLUCOSE: 124 mg/dL — AB (ref 65–99)
Globulin: 3.8 g/dL (calc) — ABNORMAL HIGH (ref 1.9–3.7)
POTASSIUM: 4.2 mmol/L (ref 3.5–5.3)
Sodium: 139 mmol/L (ref 135–146)
Total Bilirubin: 0.6 mg/dL (ref 0.2–1.2)
Total Protein: 7.7 g/dL (ref 6.1–8.1)

## 2017-08-14 LAB — QUANTIFERON-TB GOLD PLUS
Mitogen-NIL: 6.86 IU/mL
NIL: 0.11 IU/mL
QUANTIFERON-TB GOLD PLUS: NEGATIVE
TB1-NIL: 0 IU/mL
TB2-NIL: 0.01 [IU]/mL

## 2017-08-16 NOTE — Progress Notes (Signed)
TB gold negative

## 2017-08-18 ENCOUNTER — Telehealth: Payer: Self-pay

## 2017-08-18 NOTE — Telephone Encounter (Signed)
Coordinated delivery of Orencia from [employer]. Medication has been placed in the refrigerator. Please schedule nursing visit. Thanks!  Mcguire Gasparyan, [place], CPhT 2:35 PM

## 2017-08-20 ENCOUNTER — Telehealth: Payer: Self-pay | Admitting: Rheumatology

## 2017-08-20 NOTE — Telephone Encounter (Signed)
Patient states she does not have any further questions and will be here for her new start on Tuesday.

## 2017-08-20 NOTE — Telephone Encounter (Signed)
Patient called stating she was returning your call regarding her Orencia.

## 2017-08-20 NOTE — Telephone Encounter (Signed)
Patient has been scheduled for 08/24/17 at 10 am.

## 2017-08-22 ENCOUNTER — Encounter: Payer: Self-pay | Admitting: Rheumatology

## 2017-08-23 ENCOUNTER — Encounter: Payer: Self-pay | Admitting: Rheumatology

## 2017-08-24 ENCOUNTER — Ambulatory Visit (INDEPENDENT_AMBULATORY_CARE_PROVIDER_SITE_OTHER): Payer: BLUE CROSS/BLUE SHIELD | Admitting: Rheumatology

## 2017-08-24 ENCOUNTER — Other Ambulatory Visit (INDEPENDENT_AMBULATORY_CARE_PROVIDER_SITE_OTHER): Payer: Self-pay | Admitting: *Deleted

## 2017-08-24 VITALS — BP 120/72 | HR 81 | Resp 15 | Ht 65.5 in | Wt 230.0 lb

## 2017-08-24 DIAGNOSIS — Z87442 Personal history of urinary calculi: Secondary | ICD-10-CM

## 2017-08-24 DIAGNOSIS — M7581 Other shoulder lesions, right shoulder: Secondary | ICD-10-CM

## 2017-08-24 DIAGNOSIS — M722 Plantar fascial fibromatosis: Secondary | ICD-10-CM | POA: Diagnosis not present

## 2017-08-24 DIAGNOSIS — M778 Other enthesopathies, not elsewhere classified: Secondary | ICD-10-CM

## 2017-08-24 DIAGNOSIS — M0579 Rheumatoid arthritis with rheumatoid factor of multiple sites without organ or systems involvement: Secondary | ICD-10-CM

## 2017-08-24 DIAGNOSIS — M5136 Other intervertebral disc degeneration, lumbar region: Secondary | ICD-10-CM | POA: Diagnosis not present

## 2017-08-24 DIAGNOSIS — Z9049 Acquired absence of other specified parts of digestive tract: Secondary | ICD-10-CM

## 2017-08-24 DIAGNOSIS — Z8639 Personal history of other endocrine, nutritional and metabolic disease: Secondary | ICD-10-CM | POA: Diagnosis not present

## 2017-08-24 DIAGNOSIS — Z79899 Other long term (current) drug therapy: Secondary | ICD-10-CM

## 2017-08-24 DIAGNOSIS — M3500 Sicca syndrome, unspecified: Secondary | ICD-10-CM | POA: Diagnosis not present

## 2017-08-24 MED ORDER — ABATACEPT 125 MG/ML ~~LOC~~ SOAJ
125.0000 mg | Freq: Once | SUBCUTANEOUS | Status: AC
  Administered 2017-08-24: 125 mg via SUBCUTANEOUS

## 2017-08-24 MED ORDER — PREDNISONE 5 MG PO TABS: ORAL_TABLET | ORAL | 0 refills | Status: DC

## 2017-08-24 NOTE — Progress Notes (Signed)
Patient presented in office today for first Orencia injection. Proper technique was taught and patient demonstrated proper technique with demo pen. Patient injected herself in the right thigh at 10:35am. Patient tolerated the injection well and was observed in the office for 30 minutes following the injection. Patient did not experience any adverse reactions.   Administrations This Visit    Abatacept SOAJ 125 mg    Admin Date 08/24/2017 Action Given Dose 125 mg Route Subcutaneous Administered By Ellen Henri, CMA           Office Visit Note  Patient: Jessica Rice             Date of Birth: 1965/05/17           MRN: [id number]             PCP: Marrian Salvage, PA-C Referring: Marrian Salvage, PA-C Visit Date: 08/24/2017 Occupation: @    Subjective:  Pain in multiple joints.   History of Present Illness: Jessica Rice is a 52 y.o. female with history of seropositive rheumatoid arthritis.  She states she came off Enbrel about 2 weeks ago to start on Orencia.  During this time she had severe flare with pain and swelling in multiple joints.  She is currently on a prednisone taper and is taking 10 mg p.o. daily now.  She is also taking Plaquenil 200 mg twice daily Monday to Friday.  We could not use methotrexate due to alcohol intake.  Activities of Daily Living:  Patient reports morning stiffness for several hours.   Patient Reports nocturnal pain.  Difficulty dressing/grooming: Reports Difficulty climbing stairs: Reports Difficulty getting out of chair: Reports Difficulty using hands for taps, buttons, cutlery, and/or writing: Reports   Review of Systems  Constitutional: Positive for fatigue. Negative for night sweats, weight gain and weight loss.  HENT: Negative for mouth sores, trouble swallowing, trouble swallowing, mouth dryness and nose dryness.   Eyes: Positive for dryness. Negative for pain, redness and visual disturbance.  Respiratory: Negative for cough,  shortness of breath and difficulty breathing.   Cardiovascular: Negative for chest pain, palpitations, hypertension, irregular heartbeat and swelling in legs/feet.  Gastrointestinal: Negative for abdominal pain, blood in stool, constipation and diarrhea.  Endocrine: Negative for increased urination.  Genitourinary: Negative for pelvic pain and vaginal dryness.  Musculoskeletal: Positive for arthralgias, joint pain, joint swelling and morning stiffness. Negative for myalgias, muscle weakness, muscle tenderness and myalgias.  Skin: Negative for color change, rash, hair loss, skin tightness, ulcers and sensitivity to sunlight.  Allergic/Immunologic: Negative for susceptible to infections.  Neurological: Positive for headaches and weakness. Negative for dizziness, memory loss and night sweats.  Hematological: Negative for bruising/bleeding tendency and swollen glands.  Psychiatric/Behavioral: Negative for depressed mood, confusion and sleep disturbance. The patient is not nervous/anxious.     PMFS History:  Patient Active Problem List   Diagnosis Date Noted  . Rheumatoid arthritis involving multiple sites with positive rheumatoid factor (HCC) +CCP  09/28/2016  . High risk medication use 09/28/2016  . Right shoulder tendonitis 09/28/2016  . Plantar fasciitis 09/28/2016  . DDD (degenerative disc disease), lumbar 09/28/2016  . History of kidney stones 09/28/2016  . History of cholecystectomy 09/28/2016  . History of hyperlipidemia 09/28/2016  . Sicca syndrome (HCC) 09/28/2016    Past Medical History:  Diagnosis Date  . Dry eyes     Family History  Problem Relation Age of Onset  . Rheumatologic disease Mother   . Cancer Mother   .  Hypertension Father   . Heart disease Father   . Diabetes Brother    Past Surgical History:  Procedure Laterality Date  . GALLBLADDER SURGERY     Social History   Social History Narrative  . Not on file     Objective: Vital Signs: BP 120/72 (BP  Location: Right Arm, Patient Position: Sitting, Cuff Size: Normal)   Pulse 81   Resp 15   Ht 5' 5.5" (1.664 m)   Wt 230 lb (104.3 kg) Comment: per patient  BMI 37.69 kg/m    Physical Exam  Constitutional: She is oriented to person, place, and time. She appears well-developed and well-nourished.  HENT:  Head: Normocephalic and atraumatic.  Eyes: Conjunctivae and EOM are normal.  Neck: Normal range of motion.  Cardiovascular: Normal rate, regular rhythm, normal heart sounds and intact distal pulses.  Pulmonary/Chest: Effort normal and breath sounds normal.  Abdominal: Soft. Bowel sounds are normal.  Lymphadenopathy:    She has no cervical adenopathy.  Neurological: She is alert and oriented to person, place, and time.  Skin: Skin is warm and dry. Capillary refill takes less than 2 seconds.  Psychiatric: She has a normal mood and affect. Her behavior is normal.  Nursing note and vitals reviewed.    Musculoskeletal Exam: C-spine thoracic lumbar spine good range of motion. She has some discomfort range of motion of her lumbar spine.  Shoulder joints elbow joints wrist joints with good range of motion.  She is synovitis of her wrist joints, some of the MCPs and PIPs as described below.  Hip joints were in good range of motion.  She has warmth and swelling in her left knee joint.   CDAI Exam: CDAI Homunculus Exam:   Tenderness:  LUE: wrist Right hand: 2nd PIP Left hand: 2nd PIP, 3rd PIP and 4th PIP LLE: tibiofemoral  Swelling:  LUE: wrist Left hand: 2nd PIP, 3rd PIP and 4th PIP LLE: tibiofemoral  Joint Counts:  CDAI Tender Joint count: 6 CDAI Swollen Joint count: 5  Global Assessments:  Patient Global Assessment: 6 Provider Global Assessment: 6  CDAI Calculated Score: 23    Investigation: No additional findings.   Imaging: No results found.  Speciality Comments: PLQ Eye Exam: 07/28/17 WNL @ [facility] up in 6 months    Procedures:  No procedures  performed Allergies: Patient has no known allergies.   Assessment / Plan:     Visit Diagnoses: Rheumatoid arthritis involving multiple sites with positive rheumatoid factor (HCC) +CCP  -patient has been having frequent flares of arthritis.  She was switched from Enbrel to [place] today.  She had an adequate response to Enbrel.  We are unable to use methotrexate as she drinks alcohol.  We had detailed discussion regarding different treatment options.  I will increase her Plaquenil dose to 200 mg p.o. twice daily which is appropriate for her weight.  We would like to see how she does on combination therapy of Plaquenil and Orencia.  I also discussed abstinence from alcohol in the future so we can add methotrexate to her regimen.  Patient states that she will work on that.  She is currently on prednisone 10 mg.  She states she will be going on vacation and would like to take some prednisone with her.  I have given her a gradual taper now to take to stay on prednisone 10 mg p.o. daily for a week and then taper by 2.5 mg/week.  She was given Orencia subcu in the  office today and then she was observed in the office for 30 minutes.  She did not experience any adverse side effects.  Plan: Abatacept SOAJ 125 mg  High risk medication use - Orencia subcu weekly and Plaquenil 200 mg p.o. twice daily.  Her labs will be checked in a month and then every 3 months to monitor for drug toxicity.  Right shoulder tendonitis-improved  Plantar fasciitis-she has intermittent plantar fasciitis she is currently not having any problems.  DDD (degenerative disc disease), lumbar-chronic lower back pain.  History of cholecystectomy  History of kidney stones  History of hyperlipidemia  Sicca syndrome (HCC)    Orders: No orders of the defined types were placed in this encounter.  Meds ordered this encounter  Medications  . Abatacept SOAJ 125 mg    Face-to-face time spent with patient was 30 minutes. >50% of time was  spent in counseling and coordination of care.  Follow-Up Instructions: Return in about 3 months (around 11/24/2017) for Rheumatoid arthritis.   Pollyann Savoy, MD  Note - This record has been created using Animal nutritionist.  Chart creation errors have been sought, but may not always  have been located. Such creation errors do not reflect on  the standard of medical care.

## 2017-08-25 ENCOUNTER — Other Ambulatory Visit: Payer: Self-pay

## 2017-08-25 MED ORDER — PREDNISONE 5 MG PO TABS: ORAL_TABLET | ORAL | 0 refills | Status: DC

## 2017-08-25 NOTE — Progress Notes (Signed)
Pharmacy did not receive prescription that was sent yesterday. It has been resent.

## 2017-08-26 ENCOUNTER — Encounter: Payer: Self-pay | Admitting: Rheumatology

## 2017-08-26 NOTE — Telephone Encounter (Signed)
Please call in prescription for prednisone 20 mg p.o. daily and taper by 5 mg p.o. weekly.

## 2017-08-30 ENCOUNTER — Encounter: Payer: Self-pay | Admitting: Rheumatology

## 2017-08-30 MED ORDER — PREDNISONE 5 MG PO TABS: ORAL_TABLET | ORAL | 0 refills | Status: DC

## 2017-08-31 ENCOUNTER — Telehealth: Payer: Self-pay | Admitting: *Deleted

## 2017-08-31 NOTE — Telephone Encounter (Signed)
Patient has been scheduled for 09/02/17 at 10:20 am to discuss adding MTX.

## 2017-08-31 NOTE — Telephone Encounter (Signed)
error 

## 2017-08-31 NOTE — Telephone Encounter (Signed)
Patient was given a prescription for Orencia on Aug 24, 2017.  Please discuss with me the status of Orencia prescription.

## 2017-09-01 NOTE — Progress Notes (Signed)
Office Visit Note  Patient: Jessica Rice             Date of Birth: [birth year]           MRN: [id number]             PCP: Aldean Ast, PA-C Referring: Aldean Ast, PA-C Visit Date: 09/02/2017 Occupation: @GUAROCC @    Subjective:  Pain in multiple joints   History of Present Illness: Jessica Rice is a 52 y.o. female with history of seropositive rheumatoid arthritis and osteoarthritis.  Patient's first Orencia injection was on 08/24/2017.  She tolerated the injection well and feels comfortable with self injections.  She states she was started on a prednisone 10 mg daily for a week and then tapering by 2.5 mg weekly.  She states that she continue to flare while on prednisone 10 mg.  She is now taking prednisone 20 mg daily tapering by 5 mg every week.  She reports that her pain and swelling has improved significantly.  She states she has some swelling and discomfort in her bilateral shoulders bilateral wrists and bilateral hands.  She states she also has some discomfort in her knee and ankle joints.  She states that she is able to almost make a complete fist.  She states she continues to have stiffness lasting about 2 hours which is improved since her last visit stiffness lasting all day.  She would like to discuss adding on methotrexate to her current treatment regimen.    Activities of Daily Living:  Patient reports morning stiffness for 2  hours.   Patient Reports nocturnal pain.  Difficulty dressing/grooming: Denies Difficulty climbing stairs: Reports Difficulty getting out of chair: Reports Difficulty using hands for taps, buttons, cutlery, and/or writing: Reports   Review of Systems  Constitutional: Positive for fatigue.  HENT: Positive for mouth dryness. Negative for mouth sores and nose dryness.   Eyes: Positive for dryness. Negative for pain and visual disturbance.  Respiratory: Negative for cough, hemoptysis, shortness of breath and difficulty breathing.     Cardiovascular: Negative for chest pain, palpitations, hypertension and swelling in legs/feet.  Gastrointestinal: Negative for blood in stool, constipation and diarrhea.  Endocrine: Negative for increased urination.  Genitourinary: Negative for painful urination.  Musculoskeletal: Positive for arthralgias, joint pain, joint swelling and morning stiffness. Negative for myalgias, muscle weakness, muscle tenderness and myalgias.  Skin: Negative for color change, pallor, rash, hair loss, nodules/bumps, skin tightness, ulcers and sensitivity to sunlight.  Allergic/Immunologic: Negative for susceptible to infections.  Neurological: Negative for dizziness, numbness, headaches and weakness.  Hematological: Negative for swollen glands.  Psychiatric/Behavioral: Negative for depressed mood and sleep disturbance. The patient is not nervous/anxious.     PMFS History:  Patient Active Problem List   Diagnosis Date Noted  . Rheumatoid arthritis involving multiple sites with positive rheumatoid factor ([place]) +CCP  09/28/2016  . High risk medication use 09/28/2016  . Right shoulder tendonitis 09/28/2016  . Plantar fasciitis 09/28/2016  . DDD (degenerative disc disease), lumbar 09/28/2016  . History of kidney stones 09/28/2016  . History of cholecystectomy 09/28/2016  . History of hyperlipidemia 09/28/2016  . Sicca syndrome ([place]) 09/28/2016    Past Medical History:  Diagnosis Date  . Dry eyes     Family History  Problem Relation Age of Onset  . Rheumatologic disease Mother   . Cancer Mother   . Hypertension Father   . Heart disease Father   . Diabetes Brother    Past Surgical History:  Procedure Laterality Date  . GALLBLADDER SURGERY     Social History   Social History Narrative  . Not on file     Objective: Vital Signs: BP 126/80 (BP Location: Left Arm, Patient Position: Sitting, Cuff Size: Normal)   Pulse 77   Resp 15   Ht 5' 5"  (1.651 m)   Wt 227 lb (103 kg)   BMI 37.77 kg/m     Physical Exam  Constitutional: She is oriented to person, place, and time. She appears well-developed and well-nourished.  HENT:  Head: Normocephalic and atraumatic.  Eyes: Conjunctivae and EOM are normal.  Neck: Normal range of motion.  Cardiovascular: Normal rate, regular rhythm, normal heart sounds and intact distal pulses.  Pulmonary/Chest: Effort normal and breath sounds normal.  Abdominal: Soft. Bowel sounds are normal.  Lymphadenopathy:    She has no cervical adenopathy.  Neurological: She is alert and oriented to person, place, and time.  Skin: Skin is warm and dry. Capillary refill takes less than 2 seconds.  Psychiatric: She has a normal mood and affect. Her behavior is normal.  Nursing note and vitals reviewed.    Musculoskeletal Exam: C-spine good range of motion with some discomfort.  Thoracic and lumbar spine good range of motion.  No midline spinal tenderness.  No SI joint tenderness.  Shoulder joints good range of motion with discomfort.  Elbow joints good range of motion with some tenderness to palpation.  Slightly limited range of motion of bilateral wrists with tenderness.  MCPs PIPs and DIPs good range of motion.  She is able to complete fist formation bilaterally.  Hip joints, knee joints, ankle joints, MTPs, PIPs, DIPs good range of motion with no synovitis.  No tenderness of MTP joints.  She has some tenderness on the ankle joints bilaterally.  No warmth or effusion of knee joints.  Bilateral knee crepitus.    CDAI Exam: CDAI Homunculus Exam:   Tenderness:  RUE: glenohumeral LUE: glenohumeral and wrist Right hand: 2nd PIP, 3rd PIP and 4th PIP Left hand: 2nd PIP, 3rd PIP and 4th PIP RLE: tibiofemoral and tibiotalar LLE: tibiotalar  Swelling:  LUE: wrist Right hand: 4th PIP Left hand: 2nd PIP, 3rd PIP and 4th PIP  Joint Counts:  CDAI Tender Joint count: 10 CDAI Swollen Joint count: 5  Global Assessments:  Patient Global Assessment: 8 Provider Global  Assessment: 8  CDAI Calculated Score: 31    Investigation: No additional findings. CBC Latest Ref Rng & Units 08/11/2017 01/29/2017 10/01/2016  WBC 3.8 - 10.8 Thousand/uL 4.4 4.4 4.7  Hemoglobin 11.7 - 15.5 g/dL 14.7 13.8 13.3  Hematocrit 35.0 - 45.0 % 41.8 39.5 39.3  Platelets 140 - 400 Thousand/uL 157 145 165   CMP Latest Ref Rng & Units 08/11/2017 01/29/2017 10/01/2016  Glucose 65 - 99 mg/dL 124(H) 103(H) 123(H)  BUN 7 - 25 mg/dL 19 17 13   Creatinine 0.50 - 1.05 mg/dL 1.14(H) 1.00 0.96  Sodium 135 - 146 mmol/L 139 140 138  Potassium 3.5 - 5.3 mmol/L 4.2 4.2 3.9  Chloride 98 - 110 mmol/L 107 109 107  CO2 20 - 32 mmol/L 25 22 21   Calcium 8.6 - 10.4 mg/dL 9.7 9.0 8.9  Total Protein 6.1 - 8.1 g/dL 7.7 7.1 7.0  Total Bilirubin 0.2 - 1.2 mg/dL 0.6 0.6 0.6  Alkaline Phos 33 - 130 U/L - - 50  AST 10 - 35 U/L 24 19 18   ALT 6 - 29 U/L 15 19 14      Imaging:  No results found.  Speciality Comments: PLQ Eye Exam: 07/28/17 WNL @ [facility] Follow up in 6 months    Procedures:  No procedures performed Allergies: Patient has no known allergies.   Assessment / Plan:     Visit Diagnoses: Rheumatoid arthritis involving multiple sites with positive rheumatoid factor (HCC) +CCP: She continues to have synovitis of several joints as described above. She has been having recurrent rheumatoid arthritis flares.  She was started on Orencia subcutaneous injections on 08/24/2017.  She was also recently started on prednisone 10 mg daily until Orencia starts taking effect but she had to increase prednisone dose to 20 mg daily tapering by 5 mg once weekly.  Since starting on prednisone 20 mg her pain and swelling has improved significantly.  She would like to add on methotrexate to her current treatment regimen as well.  Indications, contraindications, and side effects were discussed.   She was previously on methotrexate tablets and experienced some nausea.  A prescription for Zofran 4 mg every 8 hours for nausea  was sent to the pharmacy today.  She will start on methotrexate 6 tablets once by mouth once weekly for 2 weeks and if labs are stable she will increase to methotrexate 8 tablets by mouth once weekly.  A 1 month supply of methotrexate was sent to the pharmacy today.  High risk medication use - Orencia started on 08/24/17. We are restarting her on MTX. CBC and CMP standing orders are in place.  She will have lab work in 2 weeks x2, then 2 months, then every 3 months.- Plan: CMP14+EGFR, CBC with Differential/Platelet  Primary osteoarthritis of both knees: She has bilateral knee crepitus.  No warmth or effusion of knee joints.  She has been having increased knee joint stiffness and discomfort the past few weeks.  The swelling has improved since starting on Prednisone 20 mg daily.   Primary osteoarthritis of both feet: She has PIP and DIP synovial thickening.  She has no discomfort at this time.  She has some swelling and tenderness of bilateral ankle joints.   Plantar fasciitis: She has intermittent symptoms of plantar fasciitis bilaterally.  DDD (degenerative disc disease), lumbar: She has no midline spinal tenderness at this time.  Her lower back causes occasional discomfort.  Right shoulder tendonitis: She has been having increased discomfort in bilateral shoulder joints.  She is good range of motion on exam today.  Sicca syndrome ([place]): She has chronic sicca symptoms.  Other medical conditions are listed as follows:   History of cholecystectomy  History of kidney stones  History of hyperlipidemia  Essential hypertension    Orders: Orders Placed This Encounter  Procedures  . CMP14+EGFR  . CBC with Differential/Platelet   Meds ordered this encounter  Medications  . methotrexate (RHEUMATREX) 2.5 MG tablet    Sig: Take 6 tablets by mouth once weekly for 2 weeks then take 8 tablets by mouth once weekly.    Dispense:  28 tablet    Refill:  0  . folic acid (FOLVITE) 1 MG tablet     Sig: Take 2 tablets (2 mg total) by mouth daily.    Dispense:  180 tablet    Refill:  1  . ondansetron (ZOFRAN) 4 MG tablet    Sig: Take 1 tablet (4 mg total) by mouth every 8 (eight) hours as needed for nausea or vomiting.    Dispense:  30 tablet    Refill:  0    Face-to-face time spent with  patient was 30 minutes. >50% of time was spent in counseling and coordination of care.  Follow-Up Instructions: Return in about 3 months (around 12/03/2017) for Rheumatoid arthritis, Osteoarthritis.   Ofilia Neas, PA-C I examined and evaluated the patient with Hazel Sams PA.  Patient had recent flare of rheumatoid arthritis.  She has been on prednisone taper.  She has taken oral methotrexate in the past and had to discontinue due to nausea.  We will restart her on methotrexate at 6 tablets p.o. weekly along with Zofran. The plan of care was discussed as noted above.  Bo Merino, MD Note - This record has been created using Editor, commissioning.  Chart creation errors have been sought, but may not always  have been located. Such creation errors do not reflect on  the standard of medical care.

## 2017-09-02 ENCOUNTER — Ambulatory Visit: Payer: BLUE CROSS/BLUE SHIELD | Admitting: Rheumatology

## 2017-09-02 ENCOUNTER — Encounter: Payer: Self-pay | Admitting: Physician Assistant

## 2017-09-02 VITALS — BP 126/80 | HR 77 | Resp 15 | Ht 65.0 in | Wt 227.0 lb

## 2017-09-02 DIAGNOSIS — M170 Bilateral primary osteoarthritis of knee: Secondary | ICD-10-CM

## 2017-09-02 DIAGNOSIS — Z79899 Other long term (current) drug therapy: Secondary | ICD-10-CM | POA: Diagnosis not present

## 2017-09-02 DIAGNOSIS — M19072 Primary osteoarthritis, left ankle and foot: Secondary | ICD-10-CM

## 2017-09-02 DIAGNOSIS — M5136 Other intervertebral disc degeneration, lumbar region: Secondary | ICD-10-CM

## 2017-09-02 DIAGNOSIS — M7581 Other shoulder lesions, right shoulder: Secondary | ICD-10-CM

## 2017-09-02 DIAGNOSIS — M3500 Sicca syndrome, unspecified: Secondary | ICD-10-CM | POA: Diagnosis not present

## 2017-09-02 DIAGNOSIS — M51369 Other intervertebral disc degeneration, lumbar region without mention of lumbar back pain or lower extremity pain: Secondary | ICD-10-CM

## 2017-09-02 DIAGNOSIS — Z87442 Personal history of urinary calculi: Secondary | ICD-10-CM

## 2017-09-02 DIAGNOSIS — M778 Other enthesopathies, not elsewhere classified: Secondary | ICD-10-CM

## 2017-09-02 DIAGNOSIS — Z8639 Personal history of other endocrine, nutritional and metabolic disease: Secondary | ICD-10-CM

## 2017-09-02 DIAGNOSIS — M722 Plantar fascial fibromatosis: Secondary | ICD-10-CM

## 2017-09-02 DIAGNOSIS — M19071 Primary osteoarthritis, right ankle and foot: Secondary | ICD-10-CM

## 2017-09-02 DIAGNOSIS — M0579 Rheumatoid arthritis with rheumatoid factor of multiple sites without organ or systems involvement: Secondary | ICD-10-CM | POA: Diagnosis not present

## 2017-09-02 DIAGNOSIS — I10 Essential (primary) hypertension: Secondary | ICD-10-CM

## 2017-09-02 DIAGNOSIS — Z9049 Acquired absence of other specified parts of digestive tract: Secondary | ICD-10-CM

## 2017-09-02 MED ORDER — ONDANSETRON HCL 4 MG PO TABS: 4.0000 mg | ORAL_TABLET | Freq: Three times a day (TID) | ORAL | 0 refills | Status: AC | PRN

## 2017-09-02 MED ORDER — FOLIC ACID 1 MG PO TABS: 2.0000 mg | ORAL_TABLET | Freq: Every day | ORAL | 1 refills | Status: DC

## 2017-09-02 MED ORDER — METHOTREXATE 2.5 MG PO TABS: ORAL_TABLET | ORAL | 0 refills | Status: DC

## 2017-09-02 NOTE — Patient Instructions (Addendum)
Methotrexate tablets What is this medicine? METHOTREXATE (METH oh TREX ate) is a chemotherapy drug used to treat cancer including breast cancer, leukemia, and lymphoma. This medicine can also be used to treat psoriasis and certain kinds of arthritis. This medicine may be used for other purposes; ask your health care provider or pharmacist if you have questions. COMMON BRAND NAME(S): Rheumatrex, Trexall What should I tell my health care provider before I take this medicine? They need to know if you have any of these conditions: -fluid in the stomach area or lungs -if you often drink alcohol -infection or immune system problems -kidney disease or on hemodialysis -liver disease -low blood counts, like low white cell, platelet, or red cell counts -lung disease -radiation therapy -stomach ulcers -ulcerative colitis -an unusual or allergic reaction to methotrexate, other medicines, foods, dyes, or preservatives -pregnant or trying to get pregnant -breast-feeding How should I use this medicine? Take this medicine by mouth with a glass of water. Follow the directions on the prescription label. Take your medicine at regular intervals. Do not take it more often than directed. Do not stop taking except on your doctor's advice. Make sure you know why you are taking this medicine and how often you should take it. If this medicine is used for a condition that is not cancer, like arthritis or psoriasis, it should be taken weekly, NOT daily. Taking this medicine more often than directed can cause serious side effects, even death. Talk to your healthcare provider about safe handling and disposal of this medicine. You may need to take special precautions. Talk to your pediatrician regarding the use of this medicine in children. While this drug may be prescribed for selected conditions, precautions do apply. Overdosage: If you think you have taken too much of this medicine contact a poison control center or  emergency room at once. NOTE: This medicine is only for you. Do not share this medicine with others. What if I miss a dose? If you miss a dose, talk with your doctor or health care professional. Do not take double or extra doses. What may interact with this medicine? This medicine may interact with the following medication: -acitretin -aspirin and aspirin-like medicines including salicylates -azathioprine -certain antibiotics like penicillins, tetracycline, and chloramphenicol -cyclosporine -gold -hydroxychloroquine -live virus vaccines -NSAIDs, medicines for pain and inflammation, like ibuprofen or naproxen -other cytotoxic agents -penicillamine -phenylbutazone -phenytoin -probenecid -retinoids such as isotretinoin and tretinoin -steroid medicines like prednisone or cortisone -sulfonamides like sulfasalazine and trimethoprim/sulfamethoxazole -theophylline This list may not describe all possible interactions. Give your health care provider a list of all the medicines, herbs, non-prescription drugs, or dietary supplements you use. Also tell them if you smoke, drink alcohol, or use illegal drugs. Some items may interact with your medicine. What should I watch for while using this medicine? Avoid alcoholic drinks. This medicine can make you more sensitive to the sun. Keep out of the sun. If you cannot avoid being in the sun, wear protective clothing and use sunscreen. Do not use sun lamps or tanning beds/booths. You may need blood work done while you are taking this medicine. Call your doctor or health care professional for advice if you get a fever, chills or sore throat, or other symptoms of a cold or flu. Do not treat yourself. This drug decreases your body's ability to fight infections. Try to avoid being around people who are sick. This medicine may increase your risk to bruise or bleed. Call your doctor or health care professional   if you notice any unusual bleeding. Check with your  doctor or health care professional if you get an attack of severe diarrhea, nausea and vomiting, or if you sweat a lot. The loss of too much body fluid can make it dangerous for you to take this medicine. Talk to your doctor about your risk of cancer. You may be more at risk for certain types of cancers if you take this medicine. Both men and women must use effective birth control with this medicine. Do not become pregnant while taking this medicine or until at least 1 normal menstrual cycle has occurred after stopping it. Women should inform their doctor if they wish to become pregnant or think they might be pregnant. Men should not father a child while taking this medicine and for 3 months after stopping it. There is a potential for serious side effects to an unborn child. Talk to your health care professional or pharmacist for more information. Do not breast-feed an infant while taking this medicine. What side effects may I notice from receiving this medicine? Side effects that you should report to your doctor or health care professional as soon as possible: -allergic reactions like skin rash, itching or hives, swelling of the face, lips, or tongue -breathing problems or shortness of breath -diarrhea -dry, nonproductive cough -low blood counts - this medicine may decrease the number of white blood cells, red blood cells and platelets. You may be at increased risk for infections and bleeding. -mouth sores -redness, blistering, peeling or loosening of the skin, including inside the mouth -signs of infection - fever or chills, cough, sore throat, pain or trouble passing urine -signs and symptoms of bleeding such as bloody or black, tarry stools; red or dark-brown urine; spitting up blood or brown material that looks like coffee grounds; red spots on the skin; unusual bruising or bleeding from the eye, gums, or nose -signs and symptoms of kidney injury like trouble passing urine or change in the amount  of urine -signs and symptoms of liver injury like dark yellow or brown urine; general ill feeling or flu-like symptoms; light-colored stools; loss of appetite; nausea; right upper belly pain; unusually weak or tired; yellowing of the eyes or skin Side effects that usually do not require medical attention (report to your doctor or health care professional if they continue or are bothersome): -dizziness -hair loss -tiredness -upset stomach -vomiting This list may not describe all possible side effects. Call your doctor for medical advice about side effects. You may report side effects to FDA at 1-800-FDA-1088. Where should I keep my medicine? Keep out of the reach of children. Store at room temperature between 20 and 25 degrees C (68 and 77 degrees F). Protect from light. Throw away any unused medicine after the expiration date. NOTE: This sheet is a summary. It may not cover all possible information. If you have questions about this medicine, talk to your doctor, pharmacist, or health care provider.  2018 Elsevier/Gold Standard (2014-11-19 05:39:22)   Standing Labs We placed an order today for your standing lab work.    Please come back and get your standing labs in 2 weeks x2, then 2 months, then every 3 months   We have open lab Monday through Friday from 8:30-11:30 AM and 1:30-4:00 PM  at the office of Dr. Pollyann SavoyShaili Deveshwar.   You may experience shorter wait times on Monday and Friday afternoons. The office is located at [address], [address], [place], [place] [address] No appointment  is necessary.   Labs are drawn by [facility].  You may receive a bill from [facility] for your lab work. If you have any questions regarding directions or hours of operation,  please call [phone].

## 2017-09-09 ENCOUNTER — Encounter: Payer: Self-pay | Admitting: Rheumatology

## 2017-09-13 ENCOUNTER — Telehealth: Payer: Self-pay | Admitting: Rheumatology

## 2017-09-13 ENCOUNTER — Other Ambulatory Visit: Payer: Self-pay | Admitting: *Deleted

## 2017-09-13 DIAGNOSIS — Z79899 Other long term (current) drug therapy: Secondary | ICD-10-CM

## 2017-09-13 MED FILL — ORENCIA CLICKJECT 125 MG/ML: 125 | 28 days supply | Qty: 4 | Fill #1

## 2017-09-13 NOTE — Telephone Encounter (Signed)
Lab orders faxed.

## 2017-09-13 NOTE — Telephone Encounter (Signed)
Patient called requesting her labwork orders be faxed to her PCP Dr. Marrian SalvageKaren Garrett at  [phone] by Friday 09/17/17.

## 2017-09-19 ENCOUNTER — Other Ambulatory Visit: Payer: Self-pay | Admitting: Rheumatology

## 2017-09-29 NOTE — Progress Notes (Signed)
Office Visit Note  Patient: Jessica Rice             Date of Birth: 1965-12-03           MRN: [id number]             PCP: Aldean Ast, PA-C Referring: Aldean Ast, PA-C Visit Date: 10/13/2017 Occupation: '@GUAROCC'$ @    Subjective:  Pain in multiple joints   History of Present Illness: Jessica Rice is a 52 y.o. female with history of seropositive rheumatoid arthritis, osteoarthritis, and DDD.  Patient started on Orencia subcutaneous injections on 08/24/2017.  She is also been taking prednisone 5 mg daily.  She plans on taking 5 mg of prednisone for 1 month and then going down to 2.5 mg of prednisone for 1 month.  She is taking methotrexate 8 tablets by mouth once weekly for the past 2 weeks.  Her last dose of methotrexate was on Sunday.  She states for 1 to 2 days after methotrexate she is nauseated.  She states she has not needed to take Zofran yet though.  Patient states that when she tried to stop her prednisone dose to 2.5 mg she started flaring in multiple joints again and had increased back up to 5 mg of prednisone.  She states she is currently having discomfort in bilateral shoulders bilateral elbows and bilateral hands.  Bilateral shoulder pain is been waking up at night.  She states that she continues to have some swelling in bilateral hands and bilateral elbows.  She denies any pain in her feet or knees at this time.  She states that her knee swelling is also resolved.  She states she continues to have stiffness in her hands lasting all day but all other joints are having morning stiffness of 30 to 45 minutes.  She states that her lower back is been stable without any increased pain at this time.  She continues to have sicca symptoms.   Activities of Daily Living:  Patient reports morning stiffness for 45  minutes.   Patient Reports nocturnal pain.  Difficulty dressing/grooming: Denies Difficulty climbing stairs: Denies Difficulty getting out of chair: Denies Difficulty using  hands for taps, buttons, cutlery, and/or writing: Denies   Review of Systems  Constitutional: Negative for fatigue.  HENT: Positive for mouth dryness. Negative for mouth sores and nose dryness.   Eyes: Positive for dryness. Negative for pain and visual disturbance.  Respiratory: Negative for cough, hemoptysis, shortness of breath and difficulty breathing.   Cardiovascular: Negative for chest pain, palpitations, hypertension and swelling in legs/feet.  Gastrointestinal: Negative for blood in stool, constipation and diarrhea.  Endocrine: Negative for increased urination.  Genitourinary: Negative for painful urination.  Musculoskeletal: Positive for arthralgias, joint pain and morning stiffness. Negative for joint swelling, myalgias, muscle weakness, muscle tenderness and myalgias.  Skin: Negative for color change, pallor, rash, hair loss, nodules/bumps, skin tightness, ulcers and sensitivity to sunlight.  Allergic/Immunologic: Negative for susceptible to infections.  Neurological: Negative for dizziness, numbness, headaches and weakness.  Hematological: Negative for swollen glands.  Psychiatric/Behavioral: Negative for depressed mood and sleep disturbance. The patient is not nervous/anxious.     PMFS History:  Patient Active Problem List   Diagnosis Date Noted  . Rheumatoid arthritis involving multiple sites with positive rheumatoid factor ([place]) +CCP  09/28/2016  . High risk medication use 09/28/2016  . Right shoulder tendonitis 09/28/2016  . Plantar fasciitis 09/28/2016  . DDD (degenerative disc disease), lumbar 09/28/2016  . History of kidney  stones 09/28/2016  . History of cholecystectomy 09/28/2016  . History of hyperlipidemia 09/28/2016  . Sicca syndrome ([place]) 09/28/2016    Past Medical History:  Diagnosis Date  . Dry eyes     Family History  Problem Relation Age of Onset  . Rheumatologic disease Mother   . Cancer Mother   . Hypertension Father   . Heart disease Father     . Diabetes Brother    Past Surgical History:  Procedure Laterality Date  . GALLBLADDER SURGERY     Social History   Social History Narrative  . Not on file     Objective: Vital Signs: BP 110/86 (BP Location: Left Arm, Patient Position: Sitting, Cuff Size: Large)   Resp 14   Ht '5\' 5"'$  (1.651 m)   Wt 231 lb (104.8 kg)   LMP 09/13/2017   BMI 38.44 kg/m    Physical Exam  Constitutional: She is oriented to person, place, and time. She appears well-developed and well-nourished.  HENT:  Head: Normocephalic and atraumatic.  Eyes: Conjunctivae and EOM are normal.  Neck: Normal range of motion.  Cardiovascular: Normal rate, regular rhythm, normal heart sounds and intact distal pulses.  Pulmonary/Chest: Effort normal and breath sounds normal.  Abdominal: Soft. Bowel sounds are normal.  Lymphadenopathy:    She has no cervical adenopathy.  Neurological: She is alert and oriented to person, place, and time.  Skin: Skin is warm and dry. Capillary refill takes less than 2 seconds.  Psychiatric: She has a normal mood and affect. Her behavior is normal.  Nursing note and vitals reviewed.    Musculoskeletal Exam: C-spine, thoracic spine, lumbar spine good range of motion.  No midline spinal tenderness.  No SI joint tenderness.  Shoulder joints good range of motion with no discomfort.  She has full range of motion of bilateral elbow joints with discomfort over the elbow joint line with warmth bilaterally.  Wrist joints, MCPs and PIPs, DIPs good range of motion with no synovitis.  She is complete fist formation on the right hand.  Almost complete fist formation of the left hand.  She has tenderness of several PIP joints as described below.  She has synovitis of left 3rd PIP joint.  Hip joints, knee joints, ankle joints, MTPs diabetes, DIPs good range of motion no synovitis.  No warmth or effusion of bilateral knee joints.  No tenderness of trochanter bursa bilaterally.  CDAI Exam: CDAI  Homunculus Exam:   Tenderness:  RUE: glenohumeral and ulnohumeral and radiohumeral LUE: glenohumeral and ulnohumeral and radiohumeral Right hand: 3rd PIP and 4th PIP Left hand: 2nd PIP and 3rd PIP  Swelling:  RUE: ulnohumeral and radiohumeral LUE: ulnohumeral and radiohumeral Left hand: 3rd PIP  Joint Counts:  CDAI Tender Joint count: 8 CDAI Swollen Joint count: 3  Global Assessments:  Patient Global Assessment: 4 Provider Global Assessment: 4  CDAI Calculated Score: 19    Investigation: No additional findings.PLQ eye exam: 07/28/2017 TB Gold: 08/11/2017 Negative  CBC Latest Ref Rng & Units 08/11/2017 01/29/2017 10/01/2016  WBC 3.8 - 10.8 Thousand/uL 4.4 4.4 4.7  Hemoglobin 11.7 - 15.5 g/dL 14.7 13.8 13.3  Hematocrit 35.0 - 45.0 % 41.8 39.5 39.3  Platelets 140 - 400 Thousand/uL 157 145 165   CMP Latest Ref Rng & Units 08/11/2017 01/29/2017 10/01/2016  Glucose 65 - 99 mg/dL 124(H) 103(H) 123(H)  BUN 7 - 25 mg/dL '19 17 13  '$ Creatinine 0.50 - 1.05 mg/dL 1.14(H) 1.00 0.96  Sodium 135 - 146 mmol/L 139  140 138  Potassium 3.5 - 5.3 mmol/L 4.2 4.2 3.9  Chloride 98 - 110 mmol/L 107 109 107  CO2 20 - 32 mmol/L '25 22 21  '$ Calcium 8.6 - 10.4 mg/dL 9.7 9.0 8.9  Total Protein 6.1 - 8.1 g/dL 7.7 7.1 7.0  Total Bilirubin 0.2 - 1.2 mg/dL 0.6 0.6 0.6  Alkaline Phos 33 - 130 U/L - - 50  AST 10 - 35 U/L '24 19 18  '$ ALT 6 - 29 U/L '15 19 14    '$ Imaging: No results found.  Speciality Comments: PLQ Eye Exam: 07/28/17 WNL @ [facility] Follow up in 6 months    Procedures:  No procedures performed Allergies: Patient has no known allergies.   Assessment / Plan:     Visit Diagnoses: Rheumatoid arthritis involving multiple sites with positive rheumatoid factor (HCC) +CCP: She has warmth and tenderness of bilateral elbows.  She has synovitis of left 3rd PIP joint.  She has tenderness of several PIP joints as described above.  She continues to have bilateral shoulder pain.  The pain in bilateral  shoulders continues to wake her up at night.  She was started on Orencia subcutaneous injections on 08/24/2017.  She has been on methotrexate tablets by mouth once weekly for the past 2 weeks.  She is currently on prednisone 5 mg by mouth daily to bridge her until Orencia methotrexate start taking full effect..  She will stay on prednisone 5 mg for 1 month and decrease to 2.5 mg for 1 month.  She does not need any refills of medications at this time.  She will follow up with [place] in 3 months.  She was advised to notify us if she develops increased joint pain or joint swelling.   High risk medication use - Orencia subq injections, PLQ, MTX 8 tablets by mouth once a week, folic acid.  She was given orders for CBC and CMP which she will be taken to [facility].- Plan: CMP14+EGFR, CBC with Differential/Platelet, CMP14+EGFR, CBC with Differential/Platelet, CMP14+EGFR, CBC with Differential/Platelet  Primary osteoarthritis of both knees: No warmth or effusion of knee joints.  Her ROM has improved significantly.  She has no knee pain at this time.    Primary osteoarthritis of both feet: She has osteoarthritic changes in bilateral feet.  She has no discomfort at this time. She wears proper fitting shoes.    Plantar fasciitis: Resolved.  She has no tenderness of the plantar fascia.   DDD (degenerative disc disease), lumbar: She has no midline spinal tenderness.  She continues to have chronic pain but it is not worsening.    Sicca syndrome [facility]): She continues to have sicca symptoms.  She has no parotid swelling on exam.    Right shoulder tendonitis: She continues to have right shoulder pain.  Right shoulder has full ROM on exam today.   Other medical conditions are listed as follows:   Essential hypertension  History of kidney stones  History of cholecystectomy  History of hyperlipidemia    Orders: Orders Placed This Encounter  Procedures  . CMP14+EGFR  . CBC with Differential/Platelet   No orders  of the defined types were placed in this encounter.     Follow-Up Instructions: Return in about 3 months (around 01/13/2018) for Rheumatoid arthritis, Osteoarthritis, DDD.   Ofilia Neas, PA-C  Note - This record has been created using Dragon software.  Chart creation errors have been sought, but may not always  have been located. Such creation errors do  not reflect on  the standard of medical care.

## 2017-10-05 ENCOUNTER — Other Ambulatory Visit: Payer: Self-pay | Admitting: Rheumatology

## 2017-10-05 NOTE — Telephone Encounter (Signed)
Patient states she is on the orencia for about a month and a half. Patient states she is also on 8 tabs of MTX. Patient states she was feeling okay until she got to 5 mg of Prednisone. Patient states she started flaring back up. Patient states now that she is on Prednisone 2.5 mg it is worse. PAtient states her hands are really stiff and really hurt. Patient's knee is stiff and hurting. Patient states her shoulders are as well. Patient ha a follow up on 10/13/17.

## 2017-10-05 NOTE — Telephone Encounter (Signed)
I called and discussed the situation with patient.  She has been on Orencia for approximately a month now.  Which is not long enough to see if it is efficacious or not.  She wants to stay on prednisone 5 mg a day until Orencia starts working.  I will call in prednisone 5 mg p.o. daily for 1 month and then 2.5 mg p.o. daily for 1 month.

## 2017-10-13 ENCOUNTER — Ambulatory Visit: Payer: BLUE CROSS/BLUE SHIELD | Admitting: Physician Assistant

## 2017-10-13 ENCOUNTER — Encounter: Payer: Self-pay | Admitting: Physician Assistant

## 2017-10-13 VITALS — BP 110/86 | Resp 14 | Ht 65.0 in | Wt 231.0 lb

## 2017-10-13 DIAGNOSIS — M3500 Sicca syndrome, unspecified: Secondary | ICD-10-CM | POA: Diagnosis not present

## 2017-10-13 DIAGNOSIS — M5136 Other intervertebral disc degeneration, lumbar region: Secondary | ICD-10-CM | POA: Diagnosis not present

## 2017-10-13 DIAGNOSIS — M170 Bilateral primary osteoarthritis of knee: Secondary | ICD-10-CM | POA: Diagnosis not present

## 2017-10-13 DIAGNOSIS — Z79899 Other long term (current) drug therapy: Secondary | ICD-10-CM

## 2017-10-13 DIAGNOSIS — Z9049 Acquired absence of other specified parts of digestive tract: Secondary | ICD-10-CM

## 2017-10-13 DIAGNOSIS — M19071 Primary osteoarthritis, right ankle and foot: Secondary | ICD-10-CM | POA: Diagnosis not present

## 2017-10-13 DIAGNOSIS — M778 Other enthesopathies, not elsewhere classified: Secondary | ICD-10-CM

## 2017-10-13 DIAGNOSIS — M0579 Rheumatoid arthritis with rheumatoid factor of multiple sites without organ or systems involvement: Secondary | ICD-10-CM

## 2017-10-13 DIAGNOSIS — I10 Essential (primary) hypertension: Secondary | ICD-10-CM | POA: Diagnosis not present

## 2017-10-13 DIAGNOSIS — M19072 Primary osteoarthritis, left ankle and foot: Secondary | ICD-10-CM

## 2017-10-13 DIAGNOSIS — M722 Plantar fascial fibromatosis: Secondary | ICD-10-CM | POA: Diagnosis not present

## 2017-10-13 DIAGNOSIS — M7581 Other shoulder lesions, right shoulder: Secondary | ICD-10-CM | POA: Diagnosis not present

## 2017-10-13 DIAGNOSIS — Z8639 Personal history of other endocrine, nutritional and metabolic disease: Secondary | ICD-10-CM | POA: Diagnosis not present

## 2017-10-13 DIAGNOSIS — Z87442 Personal history of urinary calculi: Secondary | ICD-10-CM | POA: Diagnosis not present

## 2017-10-13 MED FILL — ORENCIA CLICKJECT 125 MG/ML: 125 | 28 days supply | Qty: 4 | Fill #2

## 2017-10-26 ENCOUNTER — Other Ambulatory Visit: Payer: Self-pay | Admitting: Rheumatology

## 2017-10-26 NOTE — Telephone Encounter (Signed)
Last Visit: 10/13/17 Next visit: 01/19/18  Okay to refill per Dr. Corliss Skainseveshwar

## 2017-10-27 ENCOUNTER — Telehealth: Payer: Self-pay | Admitting: Rheumatology

## 2017-10-27 NOTE — Telephone Encounter (Signed)
Okay to write letter 

## 2017-10-27 NOTE — Telephone Encounter (Signed)
Patient requesting a note to be faxed to her work stating she is under Dr. Fatima Sangereveshwar's care for, and her diagnosis. Patient requests letter to be faxed to # [phone] Attn. Margot Ableseresa Barnes.

## 2017-10-27 NOTE — Telephone Encounter (Signed)
ok 

## 2017-10-28 NOTE — Telephone Encounter (Addendum)
Letter has been written and faxed to number provided by patient.

## 2017-10-31 ENCOUNTER — Encounter: Payer: Self-pay | Admitting: Rheumatology

## 2017-11-01 MED ORDER — PREDNISONE 5 MG PO TABS: ORAL_TABLET | ORAL | 0 refills | Status: DC

## 2017-11-04 ENCOUNTER — Other Ambulatory Visit: Payer: Self-pay | Admitting: Rheumatology

## 2017-11-04 NOTE — Telephone Encounter (Signed)
Last Visit: 10/13/17 Next visit: 01/19/18 Labs: 09/17/17 stable TB Gold: 08/11/17 neg   Okay to refill per Dr. Corliss Skainseveshwar

## 2017-11-10 MED FILL — ORENCIA CLICKJECT 125 MG/ML: 125 | 28 days supply | Qty: 4 | Fill #0

## 2017-11-27 ENCOUNTER — Other Ambulatory Visit: Payer: Self-pay | Admitting: Rheumatology

## 2017-12-08 MED FILL — ORENCIA CLICKJECT 125 MG/ML: 125 | 28 days supply | Qty: 4 | Fill #1

## 2018-01-03 MED FILL — ORENCIA CLICKJECT 125 MG/ML: 125 | 28 days supply | Qty: 4 | Fill #2

## 2018-01-05 NOTE — Progress Notes (Signed)
Office Visit Note  Patient: Jessica Rice             Date of Birth: 24-Jan-1966           MRN: [id number]             PCP: Marrian Salvage, PA-C Referring: Marrian Salvage, PA-C Visit Date: 01/19/2018 Occupation: @GUAROCC @  Subjective:  Pain in both hands   History of Present Illness: Jessica Rice is a 52 y.o. female with history seropositive rheumatoid arthritis, osteoarthritis, and DDD.  She is on Orencia sq injections once weekly, MTX 8 tablets by mouth once weekly, and folic acid 1 mg by mouth daily.  She reports she recently ran out of methotrexate so she only took 4 tablets on Sunday.  She states that she needs a refill today.  She continues to have some discomfort in bilateral hands and intermittent swelling.  She reports that overall her rheumatoid arthritis has improved significantly since starting on Orencia.  She states that she is having bilateral SI joint pain.  She states the pain is most severe when she is standing for prolonged periods of time.  She denies any radiation or numbness.  She denies any other joint pain or joint swelling at this time.    Activities of Daily Living:  Patient reports morning stiffness for several hours.   Patient Reports nocturnal pain.  Difficulty dressing/grooming: Denies Difficulty climbing stairs: Denies Difficulty getting out of chair: Reports Difficulty using hands for taps, buttons, cutlery, and/or writing: Denies  Review of Systems  Constitutional: Positive for fatigue.  HENT: Positive for mouth dryness. Negative for mouth sores, trouble swallowing, trouble swallowing and nose dryness.   Eyes: Positive for dryness. Negative for pain, redness, itching and visual disturbance.  Respiratory: Negative for cough, hemoptysis, shortness of breath, wheezing and difficulty breathing.   Cardiovascular: Negative for chest pain, palpitations, hypertension and swelling in legs/feet.  Gastrointestinal: Negative for abdominal pain, blood in stool,  constipation, diarrhea and nausea.  Endocrine: Negative for increased urination.  Genitourinary: Negative for painful urination, nocturia and pelvic pain.  Musculoskeletal: Positive for arthralgias, joint pain, joint swelling and morning stiffness. Negative for myalgias, muscle weakness, muscle tenderness and myalgias.  Skin: Negative for color change, pallor, rash, hair loss, nodules/bumps, skin tightness, ulcers and sensitivity to sunlight.  Allergic/Immunologic: Negative for susceptible to infections.  Neurological: Positive for headaches. Negative for dizziness, light-headedness, numbness, memory loss and weakness.  Hematological: Negative for bruising/bleeding tendency and swollen glands.  Psychiatric/Behavioral: Negative for depressed mood, confusion and sleep disturbance. The patient is not nervous/anxious.     PMFS History:  Patient Active Problem List   Diagnosis Date Noted  . Rheumatoid arthritis involving multiple sites with positive rheumatoid factor (HCC) +CCP  09/28/2016  . High risk medication use 09/28/2016  . Right shoulder tendonitis 09/28/2016  . Plantar fasciitis 09/28/2016  . DDD (degenerative disc disease), lumbar 09/28/2016  . History of kidney stones 09/28/2016  . History of cholecystectomy 09/28/2016  . History of hyperlipidemia 09/28/2016  . Sicca syndrome (HCC) 09/28/2016    Past Medical History:  Diagnosis Date  . Dry eyes     Family History  Problem Relation Age of Onset  . Rheumatologic disease Mother   . Cancer Mother   . Hypertension Father   . Heart disease Father   . Diabetes Brother    Past Surgical History:  Procedure Laterality Date  . GALLBLADDER SURGERY     Social History   Social History Narrative  .  Not on file    Objective: Vital Signs: BP 122/74 (BP Location: Left Arm, Patient Position: Sitting, Cuff Size: Large)   Pulse 77   Resp 15   Ht 5' 5.5" (1.664 m)   Wt 237 lb (107.5 kg)   BMI 38.84 kg/m    Physical Exam    Constitutional: She is oriented to person, place, and time. She appears well-developed and well-nourished.  HENT:  Head: Normocephalic and atraumatic.  Eyes: Conjunctivae and EOM are normal.  Neck: Normal range of motion.  Cardiovascular: Normal rate, regular rhythm, normal heart sounds and intact distal pulses.  Pulmonary/Chest: Effort normal and breath sounds normal.  Abdominal: Soft. Bowel sounds are normal.  Lymphadenopathy:    She has no cervical adenopathy.  Neurological: She is alert and oriented to person, place, and time.  Skin: Skin is warm and dry. Capillary refill takes less than 2 seconds.  Psychiatric: She has a normal mood and affect. Her behavior is normal.  Nursing note and vitals reviewed.    Musculoskeletal Exam: C-spine, thoracic spine, and lumbar spine good ROM.  No midline spinal tenderness. Bilateral SI joint tenderness.  Shoulder joints, elbow joints, wrist joints, MCPs, PIPs, and DIPs good ROM with no synovitis.  Hip joints, knee joints, ankle joints, MTPs, PIPs, and DIPs good ROM with no synovitis.  No warmth or effusion of knee joints.  No tenderness or swelling of ankle joints.  No tenderness of MTPs.  No tenderness of trochanteric bursa bilaterally.   CDAI Exam: CDAI Score: 0.4  Patient Global Assessment: 2 (mm); Provider Global Assessment: 2 (mm) Swollen: 0 ; Tender: 0  Joint Exam   Not documented   There is currently no information documented on the homunculus. Go to the Rheumatology activity and complete the homunculus joint exam.  Investigation: No additional findings.  Imaging: No results found.  Recent Labs: Lab Results  Component Value Date   WBC 4.4 08/11/2017   HGB 14.7 08/11/2017   PLT 157 08/11/2017   NA 139 08/11/2017   K 4.2 08/11/2017   CL 107 08/11/2017   CO2 25 08/11/2017   GLUCOSE 124 (H) 08/11/2017   BUN 19 08/11/2017   CREATININE 1.14 (H) 08/11/2017   BILITOT 0.6 08/11/2017   ALKPHOS 50 10/01/2016   AST 24 08/11/2017    ALT 15 08/11/2017   PROT 7.7 08/11/2017   ALBUMIN 3.6 10/01/2016   CALCIUM 9.7 08/11/2017   GFRAA 64 08/11/2017   QFTBGOLDPLUS NEGATIVE 08/11/2017    Speciality Comments: No specialty comments available.  Procedures:  No procedures performed Allergies: Patient has no known allergies.   Assessment / Plan:     Visit Diagnoses: Rheumatoid arthritis involving multiple sites with positive rheumatoid factor (HCC) +CCP   High risk medication use -  Orencia subq , MTX 8 tablets (20mg ) weekly, folic acid 1 mg qd  TB gold (-) 08/11/17  Primary osteoarthritis of both knees  Primary osteoarthritis of both feet  Plantar fasciitis  DDD (degenerative disc disease), lumbar  Sicca syndrome (HCC)  Right shoulder tendonitis  Essential hypertension  History of kidney stones  History of cholecystectomy  History of hyperlipidemia  Chronic left SI joint pain - Plan: XR Pelvis 1-2 Views  Chronic right SI joint pain - Plan: XR Pelvis 1-2 Views   Pharmacy Notes: Recommend increasing folic acid to 2mg  due to MTX 20mg .  TB gold negative 08/11/17. Recommend flu, pneumovax, and Shingrix vaccines.  Orders: Orders Placed This Encounter  Procedures  . XR Pelvis 1-2  Views   Meds ordered this encounter  Medications  . methotrexate (RHEUMATREX) 2.5 MG tablet    Sig: Take 8 tablets (20 mg total) by mouth once a week.    Dispense:  96 tablet    Refill:  0    Face-to-face time spent with patient was 30 minutes. Greater than 50% of time was spent in counseling and coordination of care.  Follow-Up Instructions: Return for Rheumatoid arthritis, Osteoarthritis, DDD.   Gearldine Bienenstock, PA-C  Note - This record has been created using Dragon software.  Chart creation errors have been sought, but may not always  have been located. Such creation errors do not reflect on  the standard of medical care.

## 2018-01-07 ENCOUNTER — Encounter: Payer: Self-pay | Admitting: Physician Assistant

## 2018-01-12 ENCOUNTER — Encounter: Payer: Self-pay | Admitting: Rheumatology

## 2018-01-13 MED ORDER — METHOTREXATE 2.5 MG PO TABS: 20.0000 mg | ORAL_TABLET | ORAL | 0 refills | Status: DC

## 2018-01-13 NOTE — Telephone Encounter (Addendum)
Last Visit: 10/13/17 Next visit: 01/19/18 Labs: 01/07/18 stable  Okay to refill per Dr. Corliss Skains

## 2018-01-19 ENCOUNTER — Ambulatory Visit: Payer: BLUE CROSS/BLUE SHIELD | Admitting: Physician Assistant

## 2018-01-19 ENCOUNTER — Encounter: Payer: Self-pay | Admitting: Physician Assistant

## 2018-01-19 ENCOUNTER — Ambulatory Visit (INDEPENDENT_AMBULATORY_CARE_PROVIDER_SITE_OTHER): Payer: Self-pay

## 2018-01-19 VITALS — BP 122/74 | HR 77 | Resp 15 | Ht 65.5 in | Wt 237.0 lb

## 2018-01-19 DIAGNOSIS — M19072 Primary osteoarthritis, left ankle and foot: Secondary | ICD-10-CM

## 2018-01-19 DIAGNOSIS — M533 Sacrococcygeal disorders, not elsewhere classified: Secondary | ICD-10-CM | POA: Diagnosis not present

## 2018-01-19 DIAGNOSIS — M0579 Rheumatoid arthritis with rheumatoid factor of multiple sites without organ or systems involvement: Secondary | ICD-10-CM | POA: Diagnosis not present

## 2018-01-19 DIAGNOSIS — M778 Other enthesopathies, not elsewhere classified: Secondary | ICD-10-CM

## 2018-01-19 DIAGNOSIS — Z8639 Personal history of other endocrine, nutritional and metabolic disease: Secondary | ICD-10-CM

## 2018-01-19 DIAGNOSIS — Z87442 Personal history of urinary calculi: Secondary | ICD-10-CM

## 2018-01-19 DIAGNOSIS — M170 Bilateral primary osteoarthritis of knee: Secondary | ICD-10-CM | POA: Diagnosis not present

## 2018-01-19 DIAGNOSIS — M5136 Other intervertebral disc degeneration, lumbar region: Secondary | ICD-10-CM

## 2018-01-19 DIAGNOSIS — Z79899 Other long term (current) drug therapy: Secondary | ICD-10-CM | POA: Diagnosis not present

## 2018-01-19 DIAGNOSIS — G8929 Other chronic pain: Secondary | ICD-10-CM

## 2018-01-19 DIAGNOSIS — M19071 Primary osteoarthritis, right ankle and foot: Secondary | ICD-10-CM

## 2018-01-19 DIAGNOSIS — I10 Essential (primary) hypertension: Secondary | ICD-10-CM

## 2018-01-19 DIAGNOSIS — M722 Plantar fascial fibromatosis: Secondary | ICD-10-CM

## 2018-01-19 DIAGNOSIS — M51369 Other intervertebral disc degeneration, lumbar region without mention of lumbar back pain or lower extremity pain: Secondary | ICD-10-CM

## 2018-01-19 DIAGNOSIS — M3500 Sicca syndrome, unspecified: Secondary | ICD-10-CM

## 2018-01-19 DIAGNOSIS — M7581 Other shoulder lesions, right shoulder: Secondary | ICD-10-CM

## 2018-01-19 DIAGNOSIS — Z9049 Acquired absence of other specified parts of digestive tract: Secondary | ICD-10-CM

## 2018-01-19 MED ORDER — METHOTREXATE 2.5 MG PO TABS: 20.0000 mg | ORAL_TABLET | ORAL | 0 refills | Status: DC

## 2018-01-27 ENCOUNTER — Other Ambulatory Visit: Payer: Self-pay | Admitting: Rheumatology

## 2018-01-27 NOTE — Telephone Encounter (Addendum)
Last Visit: 01/12/18 Next Visit :06/21/18 Labs: 01/07/18 stable Tb Gold: 08/11/17 Neg   Okay to refill per Dr. Corliss Skains

## 2018-02-01 MED FILL — ORENCIA CLICKJECT 125 MG/ML: 125 | 28 days supply | Qty: 4 | Fill #0

## 2018-03-02 MED FILL — ORENCIA CLICKJECT 125 MG/ML: 125 | 28 days supply | Qty: 4 | Fill #1

## 2018-03-08 NOTE — Telephone Encounter (Signed)
[facility]. Authorization has been submitted to patient's insurance via Cover My Meds. Will update once we receive a response.

## 2018-03-15 NOTE — Telephone Encounter (Signed)
Plan states existing PA for Orencia is good from 08/11/17 to 08/11/2018

## 2018-03-27 ENCOUNTER — Encounter: Payer: Self-pay | Admitting: Rheumatology

## 2018-03-28 MED FILL — ORENCIA CLICKJECT 125 MG/ML: 125 | 28 days supply | Qty: 4 | Fill #2

## 2018-04-25 ENCOUNTER — Other Ambulatory Visit: Payer: Self-pay | Admitting: Rheumatology

## 2018-04-25 NOTE — Telephone Encounter (Signed)
Last Visit: 01/12/18 Next Visit :06/21/18 Labs: 01/07/18 stable  Tb Gold: 08/11/17 Neg   Patient updated 04/22/18 and results will be faxed.   Okay to refill per Dr. Corliss Skains

## 2018-04-26 MED FILL — ORENCIA CLICKJECT 125 MG/ML: 125 | 28 days supply | Qty: 4 | Fill #0

## 2018-05-13 ENCOUNTER — Telehealth: Payer: Self-pay | Admitting: *Deleted

## 2018-05-13 NOTE — Telephone Encounter (Signed)
Spoke with patient and advised her to avoid NSAIDS per Sherron Ales, PA-Cas Creat. Mildly elevated and GFR mildly decreased.

## 2018-05-24 MED FILL — ORENCIA CLICKJECT 125 MG/ML: 125 | 28 days supply | Qty: 4 | Fill #1

## 2018-06-07 NOTE — Progress Notes (Signed)
Office Visit Note  Patient: Jessica Rice             Date of Birth: 06-18-1965           MRN: [id number]             PCP: Aldean Ast, PA-C Referring: Aldean Ast, PA-C Visit Date: 06/21/2018 Occupation: '@GUAROCC'$ @  Subjective:  Medication monitoring    History of Present Illness: Jessica Rice is a 53 y.o. female with history of seropositive rheumatoid arthritis, DDD, and osteoarthritis.  She is on Orencia 125 mg sq weekly injections, MTX 7 tablets po once weekly, and folic acid 2 mg po daily. She has not missed any doses and has not had any recent infections. She denies any recent rheumatoid arthritis flares.  She states that she has been having intermittent pain in both hands in both wrist joints but denies any joint swelling.  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  She denies any other joint pain or joint swelling at this time.  She does her lower back is doing well.  She denies any plantar fasciitis at this time.    Activities of Daily Living:  Patient reports morning stiffness for several hours.   Patient Reports nocturnal pain.  Difficulty dressing/grooming: Denies Difficulty climbing stairs: Denies Difficulty getting out of chair: Denies Difficulty using hands for taps, buttons, cutlery, and/or writing: Reports  Review of Systems  Constitutional: Positive for fatigue.  HENT: Positive for mouth dryness. Negative for mouth sores and nose dryness.   Eyes: Positive for dryness. Negative for pain and visual disturbance.  Respiratory: Negative for cough, hemoptysis, shortness of breath and difficulty breathing.   Cardiovascular: Negative for chest pain, palpitations, hypertension and swelling in legs/feet.  Gastrointestinal: Negative for blood in stool, constipation and diarrhea.  Endocrine: Negative for increased urination.  Genitourinary: Negative for difficulty urinating and  painful urination.  Musculoskeletal: Positive for arthralgias, joint pain, joint swelling and morning stiffness. Negative for myalgias, muscle weakness, muscle tenderness and myalgias.  Skin: Negative for color change, pallor, rash, hair loss, nodules/bumps, skin tightness, ulcers and sensitivity to sunlight.  Allergic/Immunologic: Negative for susceptible to infections.  Neurological: Negative for dizziness, headaches and weakness.  Hematological: Negative for bruising/bleeding tendency and swollen glands.  Psychiatric/Behavioral: Positive for sleep disturbance. Negative for depressed mood. The patient is not nervous/anxious.     PMFS History:  Patient Active Problem List   Diagnosis Date Noted  . Rheumatoid arthritis involving multiple sites with positive rheumatoid factor ([place]) +CCP  09/28/2016  . High risk medication use 09/28/2016  . Right shoulder tendonitis 09/28/2016  . Plantar fasciitis 09/28/2016  . DDD (degenerative disc disease), lumbar 09/28/2016  . History of kidney stones 09/28/2016  . History of cholecystectomy 09/28/2016  . History of hyperlipidemia 09/28/2016  . Sicca syndrome ([place]) 09/28/2016    Past Medical History:  Diagnosis Date  . Dry eyes   . Rheumatoid arthritis ([place])     Family History  Problem Relation Age of Onset  . Rheumatologic disease Mother   . Cancer Mother   . Hypertension Father   . Heart disease Father   . Diabetes Brother    Past Surgical History:  Procedure Laterality Date  . GALLBLADDER SURGERY     Social History   Social History Narrative  . Not on file   Immunization History  Administered Date(s) Administered  .  Influenza Inj Mdck Quad With Preservative 04/15/2018  . Influenza,inj,Quad PF,6+ Mos 01/26/2017     Objective: Vital Signs: BP 111/74 (BP Location: Left Arm, Patient Position: Sitting, Cuff Size: Normal)   Pulse 62   Resp 16   Ht _0  (1.651 m)   Wt 232 lb 6.4 oz (105.4 kg)   BMI 38.67 kg/m    Physical  Exam Vitals signs and nursing note reviewed.  Constitutional:      Appearance: She is well-developed.  HENT:     Head: Normocephalic and atraumatic.  Eyes:     Conjunctiva/sclera: Conjunctivae normal.  Neck:     Musculoskeletal: Normal range of motion.  Cardiovascular:     Rate and Rhythm: Normal rate and regular rhythm.     Heart sounds: Normal heart sounds.  Pulmonary:     Effort: Pulmonary effort is normal.     Breath sounds: Normal breath sounds.  Abdominal:     General: Bowel sounds are normal.     Palpations: Abdomen is soft.  Lymphadenopathy:     Cervical: No cervical adenopathy.  Skin:    General: Skin is warm and dry.     Capillary Refill: Capillary refill takes less than 2 seconds.  Neurological:     Mental Status: She is alert and oriented to person, place, and time.  Psychiatric:        Behavior: Behavior normal.      Musculoskeletal Exam: C-spine, thoracic spine, and lumbar spine good ROM.  No midline spinal tenderness.  No SI joint tenderness.  Shoulder joints, elbow joints, wrist joints, MCPs, PIPs, and DIPs good ROM with no synovitis.  Complete fist formation bilaterally.  Hip joints, knee joints, ankle joints, MTPs, PIPs, and DIPs good ROM with no synovitis.  No warmth or effusion of knee joints.  No tenderness or swelling of ankle joints. No tenderness over trochanteric bursa bilaterally.   CDAI Exam: CDAI Score: Not documented Patient Global Assessment: Not documented; Provider Global Assessment: Not documented Swollen: Not documented; Tender: Not documented Joint Exam   Not documented   There is currently no information documented on the homunculus. Go to the Rheumatology activity and complete the homunculus joint exam.  Investigation: No additional findings.  Imaging: No results found.  Recent Labs: Lab Results  Component Value Date   WBC 4.4 08/11/2017   HGB 14.7 08/11/2017   PLT 157 08/11/2017   NA 139 08/11/2017   K 4.2 08/11/2017   CL  107 08/11/2017   CO2 25 08/11/2017   GLUCOSE 124 (H) 08/11/2017   BUN 19 08/11/2017   CREATININE 1.14 (H) 08/11/2017   BILITOT 0.6 08/11/2017   ALKPHOS 50 10/01/2016   AST 24 08/11/2017   ALT 15 08/11/2017   PROT 7.7 08/11/2017   ALBUMIN 3.6 10/01/2016   CALCIUM 9.7 08/11/2017   GFRAA 64 08/11/2017   QFTBGOLDPLUS NEGATIVE 08/11/2017    Speciality Comments: No specialty comments available.  Procedures:  No procedures performed Allergies: Proparacaine   Assessment / Plan:     Visit Diagnoses: Rheumatoid arthritis involving multiple sites with positive rheumatoid factor (HCC) +CCP: She has no synovitis on exam today.  She denies any recent rheumatoid arthritis flares.  She is clinically doing well on Orencia 125 mg subcutaneous injections once weekly, methotrexate 7 tablets by mouth once weekly, and folic acid 2 mg by mouth daily.  She has not missed any doses of these medications recently.  No recent infections.  She has been experiencing intermittent pain in bilateral  hands and bilateral strength but denies any joint swelling.  She has not noticed any increased morning stiffness.  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  She was encouraged to continue on her current treatment regimen.  She does not need any refills at this time.  She was advised to notify us that she does increase joint pain or joint swelling.  She will follow-up in the office in 5 months.  High risk medication use -Orencia 125 mg weekly, methotrexate 7 tablets weekly, and folic acid 1 mg daily.  Last TB gold negative on 08/11/2017 and will monitor yearly.  Standing orders are in place. She was given future orders for CBC, CMP, and TB gold. Patient received her flu shot in January.    Plan: QuantiFERON-TB Gold Plus, CMP14+EGFR, CBC with Differential/Platelet, QuantiFERON-TB Gold Plus  Primary osteoarthritis of both knees: No warmth or effusion.  Good  ROM with no discomfort.  She has no knee pain at this time.   Primary osteoarthritis of both feet: She has no discomfort at this time.  She wears proper fitting shoes.   Plantar fasciitis: Resolved   DDD (degenerative disc disease), lumbar: No midline spinal tenderness.  She has good ROM with no discomfort.    Sicca syndrome [facility]): She continues to have chronic sicca symptoms.    Right shoulder tendonitis: She has good ROM with no discomfort.    Other medical conditions are listed as follows:   Essential hypertension  History of kidney stones  History of cholecystectomy  History of hyperlipidemia   Orders: Orders Placed This Encounter  Procedures  . QuantiFERON-TB Gold Plus   No orders of the defined types were placed in this encounter.     Follow-Up Instructions: Return in about 5 months (around 11/21/2018) for Rheumatoid arthritis, Osteoarthritis.   Ofilia Neas, PA-C  Note - This record has been created using Dragon software.  Chart creation errors have been sought, but may not always  have been located. Such creation errors do not reflect on  the standard of medical care.

## 2018-06-20 ENCOUNTER — Telehealth: Payer: Self-pay | Admitting: Rheumatology

## 2018-06-20 NOTE — Telephone Encounter (Signed)
Patient called to check on her prescription of Orencia.  Patient states she spoke with the pharmacist at [facility] pharmacy who told her they would send the prescription to Dr. Fatima Sanger office for her appointment on 06/21/18.  Patient was told that her prescription was not at the office and patient stated that she would call the pharmacy.

## 2018-06-21 ENCOUNTER — Encounter: Payer: Self-pay | Admitting: Physician Assistant

## 2018-06-21 ENCOUNTER — Other Ambulatory Visit: Payer: Self-pay

## 2018-06-21 ENCOUNTER — Ambulatory Visit: Payer: BLUE CROSS/BLUE SHIELD | Admitting: Physician Assistant

## 2018-06-21 VITALS — BP 111/74 | HR 62 | Resp 16 | Ht 65.0 in | Wt 232.4 lb

## 2018-06-21 DIAGNOSIS — Z79899 Other long term (current) drug therapy: Secondary | ICD-10-CM | POA: Diagnosis not present

## 2018-06-21 DIAGNOSIS — M19071 Primary osteoarthritis, right ankle and foot: Secondary | ICD-10-CM | POA: Diagnosis not present

## 2018-06-21 DIAGNOSIS — M5136 Other intervertebral disc degeneration, lumbar region: Secondary | ICD-10-CM

## 2018-06-21 DIAGNOSIS — M0579 Rheumatoid arthritis with rheumatoid factor of multiple sites without organ or systems involvement: Secondary | ICD-10-CM | POA: Diagnosis not present

## 2018-06-21 DIAGNOSIS — M3500 Sicca syndrome, unspecified: Secondary | ICD-10-CM

## 2018-06-21 DIAGNOSIS — M778 Other enthesopathies, not elsewhere classified: Secondary | ICD-10-CM

## 2018-06-21 DIAGNOSIS — M170 Bilateral primary osteoarthritis of knee: Secondary | ICD-10-CM | POA: Diagnosis not present

## 2018-06-21 DIAGNOSIS — Z87442 Personal history of urinary calculi: Secondary | ICD-10-CM

## 2018-06-21 DIAGNOSIS — Z9049 Acquired absence of other specified parts of digestive tract: Secondary | ICD-10-CM

## 2018-06-21 DIAGNOSIS — M19072 Primary osteoarthritis, left ankle and foot: Secondary | ICD-10-CM

## 2018-06-21 DIAGNOSIS — M722 Plantar fascial fibromatosis: Secondary | ICD-10-CM

## 2018-06-21 DIAGNOSIS — Z8639 Personal history of other endocrine, nutritional and metabolic disease: Secondary | ICD-10-CM

## 2018-06-21 DIAGNOSIS — I10 Essential (primary) hypertension: Secondary | ICD-10-CM

## 2018-06-21 DIAGNOSIS — M7581 Other shoulder lesions, right shoulder: Secondary | ICD-10-CM

## 2018-06-21 MED FILL — ORENCIA CLICKJECT 125 MG/ML: 125 | 28 days supply | Qty: 4 | Fill #2

## 2018-06-21 NOTE — Telephone Encounter (Signed)
Spoke with patient and she states she spoke with the pharmacy and they will be shipping the medication to the patient today.

## 2018-06-21 NOTE — Patient Instructions (Signed)
Standing Labs We placed an order today for your standing lab work.    Please come back and get your standing labs in May   We have open lab Monday through Friday from 8:30-11:30 AM and 1:30-4:00 PM  at the office of Dr. Pollyann Savoy.   You may experience shorter wait times on Monday and Friday afternoons. The office is located at [address], [place], [place] [address] No appointment is necessary.   Labs are drawn by [facility] [employer].  You may receive a bill from [facility] for your lab work.  If you wish to have your labs drawn at another location, please call the office 24 hours in advance to send orders.  If you have any questions regarding directions or hours of operation,  please call [phone].   Just as a reminder please drink plenty of water prior to coming for your lab work. Thanks!

## 2018-07-09 ENCOUNTER — Other Ambulatory Visit: Payer: Self-pay | Admitting: Rheumatology

## 2018-07-11 NOTE — Telephone Encounter (Addendum)
Last Visit: 01/19/2018 Next Visit: 06/21/2018 Labs: 04/22/2018 Cas Creat. Mildly elevated and GFR mildly decreased. Patient was advised to avoid NSAIDS. (per telephone encounter)  TB Gold: 08/11/2017 neg  Okay to refill per Dr. Corliss Skains.

## 2018-07-18 MED FILL — ORENCIA CLICKJECT 125 MG/ML: 125 | 28 days supply | Qty: 4 | Fill #0

## 2018-07-31 ENCOUNTER — Encounter: Payer: Self-pay | Admitting: Rheumatology

## 2018-08-01 MED ORDER — METHOTREXATE 2.5 MG PO TABS: ORAL_TABLET | ORAL | 0 refills | Status: DC

## 2018-08-01 NOTE — Telephone Encounter (Signed)
Last Visit: 06/21/2018 Next Visit: 11/22/2018 Labs: 05/13/2018 avoid NSAIDS per Sherron Ales, PA-Cas Creat. Mildly elevated and GFR mildly decreased.   Okay to refill per Dr. Corliss Skains.

## 2018-08-18 MED FILL — ORENCIA CLICKJECT 125 MG/ML: 125 | 28 days supply | Qty: 4 | Fill #1

## 2018-08-23 ENCOUNTER — Encounter: Payer: Self-pay | Admitting: Physician Assistant

## 2018-08-23 ENCOUNTER — Encounter: Payer: Self-pay | Admitting: Rheumatology

## 2018-08-23 ENCOUNTER — Ambulatory Visit: Payer: BLUE CROSS/BLUE SHIELD | Admitting: Physician Assistant

## 2018-08-23 ENCOUNTER — Other Ambulatory Visit: Payer: Self-pay

## 2018-08-23 VITALS — BP 127/83 | HR 70 | Resp 14 | Ht 65.5 in | Wt 234.4 lb

## 2018-08-23 DIAGNOSIS — M19071 Primary osteoarthritis, right ankle and foot: Secondary | ICD-10-CM

## 2018-08-23 DIAGNOSIS — Z87442 Personal history of urinary calculi: Secondary | ICD-10-CM

## 2018-08-23 DIAGNOSIS — M5136 Other intervertebral disc degeneration, lumbar region: Secondary | ICD-10-CM

## 2018-08-23 DIAGNOSIS — Z8639 Personal history of other endocrine, nutritional and metabolic disease: Secondary | ICD-10-CM

## 2018-08-23 DIAGNOSIS — M0579 Rheumatoid arthritis with rheumatoid factor of multiple sites without organ or systems involvement: Secondary | ICD-10-CM | POA: Diagnosis not present

## 2018-08-23 DIAGNOSIS — M722 Plantar fascial fibromatosis: Secondary | ICD-10-CM

## 2018-08-23 DIAGNOSIS — I10 Essential (primary) hypertension: Secondary | ICD-10-CM

## 2018-08-23 DIAGNOSIS — M170 Bilateral primary osteoarthritis of knee: Secondary | ICD-10-CM | POA: Diagnosis not present

## 2018-08-23 DIAGNOSIS — Z79899 Other long term (current) drug therapy: Secondary | ICD-10-CM

## 2018-08-23 DIAGNOSIS — M3500 Sicca syndrome, unspecified: Secondary | ICD-10-CM

## 2018-08-23 DIAGNOSIS — M7581 Other shoulder lesions, right shoulder: Secondary | ICD-10-CM

## 2018-08-23 DIAGNOSIS — M778 Other enthesopathies, not elsewhere classified: Secondary | ICD-10-CM

## 2018-08-23 DIAGNOSIS — Z9049 Acquired absence of other specified parts of digestive tract: Secondary | ICD-10-CM

## 2018-08-23 DIAGNOSIS — M25561 Pain in right knee: Secondary | ICD-10-CM

## 2018-08-23 DIAGNOSIS — M19072 Primary osteoarthritis, left ankle and foot: Secondary | ICD-10-CM

## 2018-08-23 NOTE — Telephone Encounter (Signed)
Contacted patient and scheduled an appointment for 08/23/18 at 3:00 pm for evaluation.

## 2018-08-23 NOTE — Progress Notes (Signed)
Office Visit Note  Patient: Jessica Rice             Date of Birth: 1965-08-11           MRN: [id number]             PCP: Marrian Salvage, PA-C Referring: Marrian Salvage, PA-C Visit Date: 08/23/2018 Occupation: @GUAROCC @  Subjective:  Right knee joint pain   History of Present Illness: Jessica Rice is a 53 y.o. female with history of seropositive rheumatoid arthritis, osteoarthritis, and DDD.  She is on Orencia 125 mg sq weekly injections, MTX 7 tablets po once weekly, and folic acid 2 mg po daily.  She reports 1 week ago she started having right knee joint pain and swelling.  She states the pain was severe this morning and she was unable to bear weight.  She had limited extension.  She denies any recent injuries or falls.  She reports that she recently took 2 weeks of expired methotrexate due to running out of her prescription.  She is planning on resuming her new prescription of methotrexate this week.  Activities of Daily Living:  Patient reports morning stiffness for 30 minutes.   Patient Reports nocturnal pain.  Difficulty dressing/grooming: Denies Difficulty climbing stairs: Reports Difficulty getting out of chair: Reports Difficulty using hands for taps, buttons, cutlery, and/or writing: Denies  Review of Systems  Constitutional: Negative for fatigue.  HENT: Positive for mouth dryness. Negative for mouth sores and nose dryness.   Eyes: Positive for dryness. Negative for pain and itching.  Respiratory: Negative for shortness of breath, wheezing and difficulty breathing.   Cardiovascular: Negative for chest pain, hypertension and swelling in legs/feet.  Gastrointestinal: Negative for abdominal pain, constipation and diarrhea.  Endocrine: Negative for increased urination.  Genitourinary: Negative for painful urination and pelvic pain.  Musculoskeletal: Positive for arthralgias, joint pain and morning stiffness. Negative for joint swelling.  Skin: Negative for rash and  redness.  Allergic/Immunologic: Negative for susceptible to infections.  Neurological: Positive for headaches. Negative for dizziness, light-headedness, memory loss and weakness.  Hematological: Negative for bruising/bleeding tendency.  Psychiatric/Behavioral: Positive for sleep disturbance. Negative for confusion. The patient is not nervous/anxious.     PMFS History:  Patient Active Problem List   Diagnosis Date Noted  . Rheumatoid arthritis involving multiple sites with positive rheumatoid factor (HCC) +CCP  09/28/2016  . High risk medication use 09/28/2016  . Right shoulder tendonitis 09/28/2016  . Plantar fasciitis 09/28/2016  . DDD (degenerative disc disease), lumbar 09/28/2016  . History of kidney stones 09/28/2016  . History of cholecystectomy 09/28/2016  . History of hyperlipidemia 09/28/2016  . Sicca syndrome (HCC) 09/28/2016    Past Medical History:  Diagnosis Date  . Dry eyes   . Rheumatoid arthritis (HCC)     Family History  Problem Relation Age of Onset  . Rheumatologic disease Mother   . Cancer Mother   . Hypertension Father   . Heart disease Father   . Diabetes Brother    Past Surgical History:  Procedure Laterality Date  . GALLBLADDER SURGERY     Social History   Social History Narrative  . Not on file   Immunization History  Administered Date(s) Administered  . Influenza Inj Mdck Quad With Preservative 04/15/2018  . Influenza,inj,Quad PF,6+ Mos 01/26/2017     Objective: Vital Signs: BP 127/83 (BP Location: Left Arm, Patient Position: Sitting, Cuff Size: Normal)   Pulse 70   Resp 14   Ht 5' 5.5" (  1.664 m)   Wt 234 lb 6.4 oz (106.3 kg)   BMI 38.41 kg/m    Physical Exam Vitals signs and nursing note reviewed.  Constitutional:      Appearance: She is well-developed.  HENT:     Head: Normocephalic and atraumatic.  Eyes:     Conjunctiva/sclera: Conjunctivae normal.  Neck:     Musculoskeletal: Normal range of motion.  Cardiovascular:      Rate and Rhythm: Normal rate and regular rhythm.     Heart sounds: Normal heart sounds.  Pulmonary:     Effort: Pulmonary effort is normal.     Breath sounds: Normal breath sounds.  Abdominal:     General: Bowel sounds are normal.     Palpations: Abdomen is soft.  Lymphadenopathy:     Cervical: No cervical adenopathy.  Skin:    General: Skin is warm and dry.     Capillary Refill: Capillary refill takes less than 2 seconds.  Neurological:     Mental Status: She is alert and oriented to person, place, and time.  Psychiatric:        Behavior: Behavior normal.      Musculoskeletal Exam: C-spine, thoracic spine, and lumbar spine good ROM.  no midline spinal tenderness. No SI joint tenderness.  Shoulder joints, elbow joints, wrist joints, MCPs, PIPs, DIPs good range of motion with no synovitis.  PIP and DIP synovial thickening.  Hip joints have good range of motion with no discomfort.  Right knee warmth and tenderness.  No joint effusion.  Left knee full ROM with no warmth or effusion. No tenderness or swelling of ankle joints.   CDAI Exam: CDAI Score: Not documented Patient Global Assessment: Not documented; Provider Global Assessment: Not documented Swollen: Not documented; Tender: Not documented Joint Exam   Not documented   There is currently no information documented on the homunculus. Go to the Rheumatology activity and complete the homunculus joint exam.  Investigation: No additional findings.  Imaging: No results found.  Recent Labs: Lab Results  Component Value Date   WBC 4.4 08/11/2017   HGB 14.7 08/11/2017   PLT 157 08/11/2017   NA 139 08/11/2017   K 4.2 08/11/2017   CL 107 08/11/2017   CO2 25 08/11/2017   GLUCOSE 124 (H) 08/11/2017   BUN 19 08/11/2017   CREATININE 1.14 (H) 08/11/2017   BILITOT 0.6 08/11/2017   ALKPHOS 50 10/01/2016   AST 24 08/11/2017   ALT 15 08/11/2017   PROT 7.7 08/11/2017   ALBUMIN 3.6 10/01/2016   CALCIUM 9.7 08/11/2017   GFRAA 64  08/11/2017   QFTBGOLDPLUS NEGATIVE 08/11/2017    Speciality Comments: No specialty comments available.  Procedures:  Large Joint Inj: R knee on 08/23/2018 3:59 PM Indications: pain Details: 27 G 1.5 in needle, medial approach  Arthrogram: No  Medications: 1.5 mL lidocaine 1 %; 40 mg triamcinolone acetonide 40 MG/ML Aspirate: 0 mL Outcome: tolerated well, no immediate complications Procedure, treatment alternatives, risks and benefits explained, specific risks discussed. Consent was given by the patient. Immediately prior to procedure a time out was called to verify the correct patient, procedure, equipment, support staff and site/side marked as required. Patient was prepped and draped in the usual sterile fashion.     Allergies: Proparacaine   Assessment / Plan:     Visit Diagnoses: Rheumatoid arthritis involving multiple sites with positive rheumatoid factor (HCC) +CCP: She presents today with right knee pain and warmth for the past 1 week.  She has painful  and limited extension of the right knee joint on exam.  She has not had any recent injuries or falls. No mechanical symptoms. She continues to inject Orencia 125 mg sq once weekly, MTX 7 tablets po once weekly, and folic acid 2 mg po daily.  She reports for the past 2 weeks she has been taking expired MTX due to running out of her current prescription.  She received her shipment of MTX today and is planning on start with these new tablets this week.  A right knee joint cortisone injection was performed today.  She was advised to notify us if she develops increased joint pain or joint swelling.  She will follow up in 5 months.   High risk medication use - Orencia 125 mg sq weekly, methotrexate 7 tablets weekly, and folic acid 2 mg daily.  Last TB gold negative on 08/11/2017 and will monitor yearly. Updated today.  CBC and CMP were drawn today.    Primary osteoarthritis of both knees: She presents today with right knee joint pain and  warmth for the past 1 week.  She has painful and limited extension and difficulty bearing full weight.  She has no mechanical symptoms.  She has not had any recent injuries or falls.  A right knee cortisone injection was performed.  She tolerated the procedure well and aftercare was discussed.  Procedure note completed above.  Left knee has good ROM with no warmth or effusion.    Primary osteoarthritis of both feet: She has no feet pain or joint swelling at this time.  No tenderness or swelling of ankle joints.   Plantar fasciitis: Resolved.   DDD (degenerative disc disease), lumbar: No discomfort.  She has good ROM with no midline spinal tenderness.    Sicca syndrome (HCC): She has chronic sicca symptoms.    Right shoulder tendonitis: She has good ROM with no discomfort.   Other medical conditions are listed as follows:   Essential hypertension  History of kidney stones  History of cholecystectomy  History of hyperlipidemia   Orders: Orders Placed This Encounter  Procedures  . Large Joint Inj  . QuantiFERON-TB Gold Plus  . CBC with Differential/Platelet  . COMPLETE METABOLIC PANEL WITH GFR   No orders of the defined types were placed in this encounter.   Face-to-face time spent with patient was 30 minutes. Greater than 50% of time was spent in counseling and coordination of care.  Follow-Up Instructions: Return in about 5 months (around 01/23/2019) for Rheumatoid arthritis, Osteoarthritis.   Gearldine Bienenstockaylor M Lanecia Sliva, PA-C  Note - This record has been created using Dragon software.  Chart creation errors have been sought, but may not always  have been located. Such creation errors do not reflect on  the standard of medical care.

## 2018-08-24 NOTE — Progress Notes (Signed)
Labs are WNL.

## 2018-08-25 LAB — CBC WITH DIFFERENTIAL/PLATELET
Absolute Monocytes: 451 cells/uL (ref 200–950)
Basophils Absolute: 29 cells/uL (ref 0–200)
Basophils Relative: 0.6 %
Eosinophils Absolute: 72 cells/uL (ref 15–500)
Eosinophils Relative: 1.5 %
HCT: 39.1 % (ref 35.0–45.0)
Hemoglobin: 13.5 g/dL (ref 11.7–15.5)
Lymphs Abs: 1958 cells/uL (ref 850–3900)
MCH: 32.5 pg (ref 27.0–33.0)
MCHC: 34.5 g/dL (ref 32.0–36.0)
MCV: 94.2 fL (ref 80.0–100.0)
MPV: 12.6 fL — ABNORMAL HIGH (ref 7.5–12.5)
Monocytes Relative: 9.4 %
Neutro Abs: 2290 cells/uL (ref 1500–7800)
Neutrophils Relative %: 47.7 %
Platelets: 161 10*3/uL (ref 140–400)
RBC: 4.15 10*6/uL (ref 3.80–5.10)
RDW: 14.6 % (ref 11.0–15.0)
Total Lymphocyte: 40.8 %
WBC: 4.8 10*3/uL (ref 3.8–10.8)

## 2018-08-25 LAB — QUANTIFERON-TB GOLD PLUS
Mitogen-NIL: >10 IU/mL
NIL: 0.03 IU/mL
QuantiFERON-TB Gold Plus: NEGATIVE
TB1-NIL: 0 IU/mL
TB2-NIL: 0.01 IU/mL

## 2018-08-25 LAB — COMPLETE METABOLIC PANEL WITH GFR
AG Ratio: 1.3 (calc) (ref 1.0–2.5)
ALT: 27 U/L (ref 6–29)
AST: 30 U/L (ref 10–35)
Albumin: 4.2 g/dL (ref 3.6–5.1)
Alkaline phosphatase (APISO): 52 U/L (ref 37–153)
BUN: 15 mg/dL (ref 7–25)
CO2: 25 mmol/L (ref 20–32)
Calcium: 9.7 mg/dL (ref 8.6–10.4)
Chloride: 106 mmol/L (ref 98–110)
Creat: 1 mg/dL (ref 0.50–1.05)
GFR, Est African American: 75 mL/min/{1.73_m2} (ref >=60)
GFR, Est Non African American: 65 mL/min/{1.73_m2} (ref >=60)
Globulin: 3.2 g/dL (calc) (ref 1.9–3.7)
Glucose, Bld: 92 mg/dL (ref 65–99)
Potassium: 4.5 mmol/L (ref 3.5–5.3)
Sodium: 139 mmol/L (ref 135–146)
Total Bilirubin: 0.7 mg/dL (ref 0.2–1.2)
Total Protein: 7.4 g/dL (ref 6.1–8.1)

## 2018-08-25 NOTE — Progress Notes (Signed)
TB gold negative

## 2018-09-13 ENCOUNTER — Other Ambulatory Visit: Payer: Self-pay | Admitting: Rheumatology

## 2018-09-14 MED FILL — ORENCIA CLICKJECT 125 MG/ML: 125 | 28 days supply | Qty: 4 | Fill #2

## 2018-09-14 NOTE — Telephone Encounter (Signed)
Last Visit: 08/23/2018 Next Visit: 01/24/2019  Okay to refill per Dr. Estanislado Pandy.

## 2018-10-10 ENCOUNTER — Other Ambulatory Visit: Payer: Self-pay | Admitting: Rheumatology

## 2018-10-10 NOTE — Telephone Encounter (Signed)
Last Visit: 08/23/2018 Next Visit: 01/24/2019 Labs: 08/23/18 WNL TB Gold: 08/23/18 Neg   Okay to refill per Dr. Estanislado Pandy.

## 2018-10-11 MED FILL — ORENCIA CLICKJECT 125 MG/ML: 125 | 28 days supply | Qty: 4 | Fill #0

## 2018-11-03 NOTE — Telephone Encounter (Signed)
Called plan to verify dates of current Orencia Prior Authorization. Per Rep, Brayton Layman. No authorization is currently on file or required. Rep ran future claims, and plan is paying.  Phone# [phone]  8:48 AM Beatriz Chancellor, CPhT

## 2018-11-09 MED FILL — ORENCIA CLICKJECT 125 MG/ML: 125 | 28 days supply | Qty: 4 | Fill #1

## 2018-11-22 ENCOUNTER — Ambulatory Visit: Payer: Self-pay | Admitting: Physician Assistant

## 2018-12-06 MED FILL — ORENCIA CLICKJECT 125 MG/ML: 125 | 28 days supply | Qty: 4 | Fill #2

## 2019-01-02 ENCOUNTER — Other Ambulatory Visit: Payer: Self-pay | Admitting: Rheumatology

## 2019-01-02 NOTE — Telephone Encounter (Addendum)
Last Visit: 08/23/2018 Next Visit: 01/24/2019 Labs: 08/23/18 WNL TB Gold: 08/23/18 Neg   Patient states she updated labs on 12/29/18 and will be faxed to our office.  Okay to refill 30 day supply per Dr. Estanislado Pandy

## 2019-01-04 MED FILL — ORENCIA CLICKJECT 125 MG/ML: 125 | 28 days supply | Qty: 4 | Fill #0

## 2019-01-06 NOTE — Progress Notes (Signed)
Office Visit Note  Patient: Jessica Rice             Date of Birth: 1965/05/26           MRN: [id number]             PCP: Aldean Ast, PA-C Referring: Aldean Ast, PA-C Visit Date: 01/20/2019 Occupation: @GUAROCC @  Subjective:  Pain in both hands    History of Present Illness: Jessica Rice is a 53 y.o. female with history of seropositive rheumatoid arthritis, osteoarthritis, and DDD.  Patient is on Orencia 125 mg every days injections once weekly, methotrexate 6 tablets by mouth once weekly, and folic acid 2 mg by mouth daily.  She has not missed any doses of these medications recently.  She presents today with chronic pain in both hands.  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  She notices intermittent swelling in her hands and has noticed decreased range of motion of the left index finger.  She continues have intermittent pain in both wrist joints.  She has occasional pain in bilateral shoulder joints and bilateral ankle joints.  She has persistent right ankle joint pain and swelling.  She takes ibuprofen 200 mg 4 tablets by mouth daily for pain relief.  She has been using CBD cream and gel topically for pain relief.  She had updated lab work on 12/29/2018 which she sent to our office     Activities of Daily Living:  Patient reports morning stiffness for 20-30 minutes.   Patient Reports nocturnal pain.  Difficulty dressing/grooming: Denies Difficulty climbing stairs: Denies Difficulty getting out of chair: Denies Difficulty using hands for taps, buttons, cutlery, and/or writing: Reports  Review of Systems  Constitutional: Positive for fatigue.  HENT: Positive for mouth dryness. Negative for mouth sores and nose dryness.   Eyes: Positive for dryness.  Respiratory: Negative for shortness of breath and difficulty breathing.   Cardiovascular: Negative for chest pain and palpitations.  Gastrointestinal: Negative for  blood in stool, constipation and diarrhea.  Endocrine: Negative for increased urination.  Genitourinary: Negative for difficulty urinating and painful urination.  Musculoskeletal: Positive for arthralgias, joint pain, joint swelling and morning stiffness.  Skin: Negative for rash.  Allergic/Immunologic: Negative for susceptible to infections.  Neurological: Positive for headaches. Negative for dizziness, light-headedness, numbness, memory loss and weakness.  Hematological: Negative for bruising/bleeding tendency.  Psychiatric/Behavioral: Positive for sleep disturbance. Negative for confusion.    PMFS History:  Patient Active Problem List   Diagnosis Date Noted  . Rheumatoid arthritis involving multiple sites with positive rheumatoid factor ([place]) +CCP  09/28/2016  . High risk medication use 09/28/2016  . Right shoulder tendonitis 09/28/2016  . Plantar fasciitis 09/28/2016  . DDD (degenerative disc disease), lumbar 09/28/2016  . History of kidney stones 09/28/2016  . History of cholecystectomy 09/28/2016  . History of hyperlipidemia 09/28/2016  . Sicca syndrome ([place]) 09/28/2016    Past Medical History:  Diagnosis Date  . Dry eyes   . Rheumatoid arthritis ([place])     Family History  Problem Relation Age of Onset  . Rheumatologic disease Mother   . Cancer Mother   . Hypertension Father   . Heart disease Father   . Diabetes Brother    Past Surgical History:  Procedure Laterality Date  . GALLBLADDER SURGERY     Social History   Social History Narrative  . Not on file   Immunization History  Administered Date(s)  Administered  . Influenza Inj Mdck Quad With Preservative 04/15/2018  . Influenza,inj,Quad PF,6+ Mos 01/26/2017     Objective: Vital Signs: BP 122/72 (BP Location: Left Arm, Patient Position: Sitting, Cuff Size: Large)   Pulse 75   Resp 15   Ht 5\' 5"  (1.651 m)   Wt 241 lb 9.6 oz (109.6 kg)   BMI 40.20 kg/m    Physical Exam Vitals signs and nursing note  reviewed.  Constitutional:      Appearance: She is well-developed.  HENT:     Head: Normocephalic and atraumatic.  Eyes:     Conjunctiva/sclera: Conjunctivae normal.  Neck:     Musculoskeletal: Normal range of motion.  Cardiovascular:     Rate and Rhythm: Normal rate and regular rhythm.     Heart sounds: Normal heart sounds.  Pulmonary:     Effort: Pulmonary effort is normal.     Breath sounds: Normal breath sounds.  Abdominal:     General: Bowel sounds are normal.     Palpations: Abdomen is soft.  Lymphadenopathy:     Cervical: No cervical adenopathy.  Skin:    General: Skin is warm and dry.     Capillary Refill: Capillary refill takes less than 2 seconds.  Neurological:     Mental Status: She is alert and oriented to person, place, and time.  Psychiatric:        Behavior: Behavior normal.      Musculoskeletal Exam: C-spine, thoracic spine, and lumbar spine good ROM.  No midline spinal tenderness.  No SI joint tenderness.  Shoulder joints good ROM with some discomfort bilaterally.  Elbow joints good ROM with no tenderness or inflammation.  Limited ROM of both wrist joints.  Tenderness of both wrist joints noted.  Tenderness and synovitis of the left 2nd MCP and PIP joint.  Tenderness of several PIP and MCP joints as shown below.  Hip joints and knee joints good ROM.  No warmth or effusion of knee joints.  Tenderness and synovitis of the right ankle joint.   CDAI Exam: CDAI Score: 9.8  Patient Global: 4 mm; Provider Global: 4 mm Swollen: 3 ; Tender: 8  Joint Exam      Right  Left  MCP 2   Tender  Swollen Tender  MCP 3   Tender   Tender  MCP 4      Tender  PIP 2     Swollen Tender  PIP 3      Tender  Ankle  Swollen Tender        Investigation: No additional findings.  Imaging: No results found.  Recent Labs: Lab Results  Component Value Date   WBC 4.8 08/23/2018   HGB 13.5 08/23/2018   PLT 161 08/23/2018   NA 139 08/23/2018   K 4.5 08/23/2018   CL 106  08/23/2018   CO2 25 08/23/2018   GLUCOSE 92 08/23/2018   BUN 15 08/23/2018   CREATININE 1.00 08/23/2018   BILITOT 0.7 08/23/2018   ALKPHOS 50 10/01/2016   AST 30 08/23/2018   ALT 27 08/23/2018   PROT 7.4 08/23/2018   ALBUMIN 3.6 10/01/2016   CALCIUM 9.7 08/23/2018   GFRAA 75 08/23/2018   QFTBGOLDPLUS NEGATIVE 08/23/2018    Speciality Comments: No specialty comments available.  Procedures:  No procedures performed Allergies: Proparacaine   Assessment / Plan:     Visit Diagnoses: Rheumatoid arthritis involving multiple sites with positive rheumatoid factor (HCC) +CCP: She has synovitis of the left 2nd MCP and  PIP joint. She has flexor tenosynovitis of the left 2nd digit. The right ankle joint has tenderness and synovitis.  She continues to have intermittent pain in multiple joints including both shoulder joints, both wrist joints, both hands, and the right ankle joint. 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 She is on Orencia 125 mg sq injections once weekly, MTX 6 tablets by mouth once weekly, and folic acid 2 mg po daily.  She has not missed any doses recently.  She has not been on prednisone recently.  She has been taking Ibuprofen 200 mg 4 tablets daily for pain relief and using CBD cream topically.  She does not want to increase the dose of MTX at this time.  We discussed switching her to the injectable form of MTX to increase the efficacy.  We will apply for Otrexup 15 mg sq weekly injections.  She was given 2 samples today in the office.  She will continue on Orenica as prescribed.  She was advised to notify us if she has persistent joint pain and joint swelling.  She will follow up in 3 months.   High risk medication use -Orencia Clickject 125 mg/ml, Otrexup 15 mg sq weekly injections (Inadequate  response to tablets), and folic acid 1 mg 2 tablets daily. Last TB gold negative on 08/23/2018 and will monitor yearly.  CBC and CMP were drawn on 12/29/18.    Primary osteoarthritis of both knees: She has intermittent pain in both knees.  No warmth or effusion of knee joints.    Primary osteoarthritis of both feet: She has intermittent pain in both feet.  She has no tenderness or inflammation of MTP or PIP joints at this time.  She has right ankle joint tenderness and synovitis.    Plantar fasciitis: She experiences intermittent symptoms of plantar fasciitis in both feet.   DDD (degenerative disc disease), lumbar: She has no lower back pain at this time. She has no symptoms of radiculopathy at this time.   Sicca syndrome (HCC): She has chronic sicca symptoms.     Right shoulder tendonitis: She has intermittent right shoulder discomfort.  She has good ROM on exam.   Essential hypertension  History of kidney stones  History of cholecystectomy  History of hyperlipidemia  Orders: No orders of the defined types were placed in this encounter.  No orders of the defined types were placed in this encounter.   Face-to-face time spent with patient was 30 minutes. Greater than 50% of time was spent in counseling and coordination of care.  Follow-Up Instructions: Return in about 3 months (around 04/22/2019) for Rheumatoid arthritis.   Gearldine Bienenstock, PA-C  Note - This record has been created using Dragon software.  Chart creation errors have been sought, but may not always  have been located. Such creation errors do not reflect on  the standard of medical care.

## 2019-01-20 ENCOUNTER — Other Ambulatory Visit: Payer: Self-pay

## 2019-01-20 ENCOUNTER — Encounter: Payer: Self-pay | Admitting: Physician Assistant

## 2019-01-20 ENCOUNTER — Ambulatory Visit (INDEPENDENT_AMBULATORY_CARE_PROVIDER_SITE_OTHER): Payer: BC Managed Care – PPO | Admitting: Physician Assistant

## 2019-01-20 ENCOUNTER — Telehealth: Payer: Self-pay

## 2019-01-20 VITALS — BP 122/72 | HR 75 | Resp 15 | Ht 65.0 in | Wt 241.6 lb

## 2019-01-20 DIAGNOSIS — M19071 Primary osteoarthritis, right ankle and foot: Secondary | ICD-10-CM | POA: Diagnosis not present

## 2019-01-20 DIAGNOSIS — M0579 Rheumatoid arthritis with rheumatoid factor of multiple sites without organ or systems involvement: Secondary | ICD-10-CM | POA: Diagnosis not present

## 2019-01-20 DIAGNOSIS — Z87442 Personal history of urinary calculi: Secondary | ICD-10-CM

## 2019-01-20 DIAGNOSIS — M778 Other enthesopathies, not elsewhere classified: Secondary | ICD-10-CM

## 2019-01-20 DIAGNOSIS — M5136 Other intervertebral disc degeneration, lumbar region: Secondary | ICD-10-CM

## 2019-01-20 DIAGNOSIS — M19072 Primary osteoarthritis, left ankle and foot: Secondary | ICD-10-CM

## 2019-01-20 DIAGNOSIS — Z79899 Other long term (current) drug therapy: Secondary | ICD-10-CM | POA: Diagnosis not present

## 2019-01-20 DIAGNOSIS — M722 Plantar fascial fibromatosis: Secondary | ICD-10-CM

## 2019-01-20 DIAGNOSIS — Z8639 Personal history of other endocrine, nutritional and metabolic disease: Secondary | ICD-10-CM

## 2019-01-20 DIAGNOSIS — Z9049 Acquired absence of other specified parts of digestive tract: Secondary | ICD-10-CM

## 2019-01-20 DIAGNOSIS — M170 Bilateral primary osteoarthritis of knee: Secondary | ICD-10-CM

## 2019-01-20 DIAGNOSIS — M7581 Other shoulder lesions, right shoulder: Secondary | ICD-10-CM

## 2019-01-20 DIAGNOSIS — M51369 Other intervertebral disc degeneration, lumbar region without mention of lumbar back pain or lower extremity pain: Secondary | ICD-10-CM

## 2019-01-20 DIAGNOSIS — I10 Essential (primary) hypertension: Secondary | ICD-10-CM

## 2019-01-20 DIAGNOSIS — M3500 Sicca syndrome, unspecified: Secondary | ICD-10-CM

## 2019-01-20 MED ORDER — RASUVO 15 MG/0.3ML ~~LOC~~ SOAJ: 15.0000 mg | SUBCUTANEOUS | 0 refills | Status: DC

## 2019-01-20 MED ORDER — PREDNISONE 5 MG PO TABS: ORAL_TABLET | ORAL | 0 refills | Status: DC

## 2019-01-20 NOTE — Telephone Encounter (Signed)
Received notification from [facility] regarding a prior authorization for RASUVO. Authorization has been APPROVED from 01/20/19 to 01/20/20.   Will send document to scan center.  Authorization # [id number]  2:39 PM Beatriz Chancellor, CPhT

## 2019-01-20 NOTE — Telephone Encounter (Signed)
Patient's plan prefers Rasu[facility].  Submitted a Prior Authorization request to [employer] for [place] via Cover My Meds. Will update once we receive a response.  1:45 PM Beatriz Chancellor, CPhT

## 2019-01-20 NOTE — Telephone Encounter (Signed)
Attempted to contact patient and left message on machine to advise patient to call the office.  

## 2019-01-20 NOTE — Telephone Encounter (Signed)
Advised patient that Jessica Rice is preferred by her plan, patient verbalized understanding.  Prescription has been sent to [facility] (patient's request) and advised patient to complete co-pay card online. Patient verbalized understanding.

## 2019-01-20 NOTE — Telephone Encounter (Signed)
Please apply for otrexup 15mg , per Hazel Sams, PA-C. Thanks!   Provided patient with 2 samples and co-pay card at visit today.

## 2019-01-20 NOTE — Progress Notes (Signed)
Medication Samples have been provided to the patient.  Drug name: Otrexup     Strength: 15mg         Qty: 2 LOT: [id number], [id number] Exp.Date: 01/2020, 03/2019  Dosing instructions: inject 15mg  into the skin once weekly.   The patient has been instructed regarding the correct time, dose, and frequency of taking this medication, including desired effects and most common side effects.   Jessica Rice 11:54 AM 01/20/2019

## 2019-01-24 ENCOUNTER — Ambulatory Visit: Payer: BLUE CROSS/BLUE SHIELD | Admitting: Physician Assistant

## 2019-02-01 ENCOUNTER — Other Ambulatory Visit: Payer: Self-pay | Admitting: Rheumatology

## 2019-02-02 NOTE — Telephone Encounter (Signed)
Last Visit: 01/20/19 Next visit: 04/28/19 Labs: 12/28/18 Glucose 117, Chloride 107, MCV 101, Platelets 142 TB Gold: 08/23/18 Neg   Okay to refill per Dr. Estanislado Pandy

## 2019-02-07 MED FILL — ORENCIA CLICKJECT 125 MG/ML: 125 | 28 days supply | Qty: 4 | Fill #0

## 2019-03-07 MED FILL — ORENCIA CLICKJECT 125 MG/ML: 125 | 28 days supply | Qty: 4 | Fill #1

## 2019-04-04 MED FILL — ORENCIA CLICKJECT 125 MG/ML: 125 | 28 days supply | Qty: 4 | Fill #2

## 2019-04-13 ENCOUNTER — Other Ambulatory Visit: Payer: Self-pay | Admitting: Rheumatology

## 2019-04-13 NOTE — Telephone Encounter (Signed)
Last Visit: 01/20/19 Next visit: 04/28/19 Labs: 12/28/18 Glucose 117, Chloride 107, MCV 101, Platelets 142  Patient advised she is due to update labs Patient states she will update 04/18/19.  Okay to refill 30 day supply per Dr. Corliss Skains

## 2019-04-19 NOTE — Progress Notes (Signed)
Virtual Visit via Video Note  I connected with Jessica Rice on 04/26/19 at  2:45 PM EST by a video enabled telemedicine application and verified that I am speaking with the correct person using two identifiers.  Location: Patient: Home Provider: Clinic This service was conducted via virtual visit.  Both audio and visual tools were used.  The patient was located at home. I was located in my office.  Consent was obtained prior to the virtual visit and is aware of possible charges through their insurance for this visit.  The patient is an established patient.  Dr. Estanislado Pandy, MD conducted the virtual visit and Hazel Sams, PA-C acted as scribe during the service.  Office staff helped with scheduling follow up visits after the service was conducted.     I discussed the limitations of evaluation and management by telemedicine and the availability of in person appointments. The patient expressed understanding and agreed to proceed.  CC: Pain in multiple joints  History of Present Illness: Jessica Rice is a 54 y.o. female with history of seropositive rheumatoid arthritis, osteoarthritis, and DDD.  Patient is on Orencia 125 mg every days injections once weekly, Rasuvo 15mg  once weekly, and folic acid 2 mg by mouth daily.  She is currently experiencing pain in both hands, which started yesterday. She is having stiffness and some swelling in the right hand.  She has noticed mild improvement since switching from MTX tablets to injectable MTX. She states she is able to make a complete fist, which is an improvement.  She has been experiencing nocturnal pain in both shoulder joints.  She denies any discomfort in her shoulder joints during the day.  Her right ankle joint pain and swelling has resolved.   Review of Systems  Constitutional: Negative for fever and malaise/fatigue.  HENT: Negative for congestion.   Eyes: Negative for photophobia, pain, discharge and redness.  Respiratory: Negative for cough,  shortness of breath and wheezing.   Cardiovascular: Negative for chest pain, palpitations and leg swelling.  Gastrointestinal: Negative for blood in stool, constipation and diarrhea.  Genitourinary: Negative for dysuria and frequency.  Musculoskeletal: Positive for joint pain. Negative for back pain, myalgias and neck pain.       +Joint stiffness   Skin: Negative for rash.  Neurological: Negative for dizziness, weakness and headaches.  Endo/Heme/Allergies: Does not bruise/bleed easily.  Psychiatric/Behavioral: Negative for depression and memory loss. The patient is not nervous/anxious and does not have insomnia.      Observations/Objective:  Physical Exam  Constitutional: She is oriented to person, place, and time and well-developed, well-nourished, and in no distress.  HENT:  Head: Normocephalic and atraumatic.  Eyes: Conjunctivae are normal.  Pulmonary/Chest: Effort normal.  Neurological: She is alert and oriented to person, place, and time.  Psychiatric: Mood, memory, affect and judgment normal.   Patient reports joint stiffness all day  Patient reports nocturnal pain.  Difficulty dressing/grooming: Denies Difficulty climbing stairs: Denies Difficulty getting out of chair: Denies Difficulty using hands for taps, buttons, cutlery, and/or writing: Reports   Assessment and Plan: Visit Diagnoses: Rheumatoid arthritis involving multiple sites with positive rheumatoid factor ([place]) +CCP: She continues to have intermittent pain and inflammation in both hands.  She started having right hand stiffness and inflammation in the right 3rd PIP joint yesterday. She contributes her flare to having to use her hands more while working from home.   She has also been experiencing nocturnal pain in both shoulder joints. She is on Orencia 125  mg sq injections once weekly, Rasuvo 15 mg sq once weekly, and folic acid 2 mg po daily. She noticed mild improvement since switching from oral MTX to injectable  MTX.  Her right ankle joint pain and inflammation has resolved.She is able to make a complete fist bilaterally, which is an improvement as well.  We are apprehensive to increase the dose of Rasuvo since she drinks 1 beer per night. We discussed adding on plaquenil to the current regimen.She tolerated PLQ in the past. She will restart on Plaquenil 200 mg 1 tablet by mouth twice daily. She will come by the office to sign the consent form prior to [place] sending in the prescription.  She was encouraged to schedule an appointment with her ophthalmologist for an updated eye exam. If she continues to have recurrent flares we will discuss switching her from Orencia to another biologic. She will follow up in 3 months.    Patient was counseled on the purpose, proper use, and adverse effects of hydroxychloroquine including nausea/diarrhea, skin rash, headaches, and sun sensitivity.  Discussed importance of annual eye exams while on hydroxychloroquine to monitor to ocular toxicity and discussed importance of frequent laboratory monitoring.  Provided patient with eye exam form for baseline ophthalmologic exam.  Provided patient with educational materials on hydroxychloroquine and answered all questions.  Patient consented to hydroxychloroquine.  Will upload consent in the media tab.    Dose will be Plaquenil 200 mg twice daily.  Prescription pending lab results.  High risk medication use -Orencia Clickject 125 mg/ml, Rasuvo 15 mg sq weekly injections (Inadequate response to tablets), and folic acid 1 mg 2 tablets daily.  She will be adding on plaquenil 200 mg 1 tablet by mouth twice daily.  She had a inadequate response to Enbrel and Humira in the past. Last TB gold negative on 08/23/2018 and will monitor yearly. CBC and CMP were drawn on 04/21/19. Glucose was 124, rest of CMP WNL.  CBC WNL. She will be due to update lab work in April and every 3 months. She received the first covid-19 vaccine today.  Primary osteoarthritis  of both knees: She is not having any knee joint pain or inflammation at this time.  Primary osteoarthritis of both feet: She is not having any feet pain or inflammation at this time.  Her right ankle joint pain and inflammation has resolved.    Plantar fasciitis: Resolved  DDD (degenerative disc disease), lumbar: She is not having any lower back pain at this time.  No symptoms of radiculopathy.    Sicca syndrome (HCC): She has chronic sicca symptoms.    Right shoulder tendonitis: She experiences nocturnal pain in both shoulder joints.  She is not having any right shoulder joint pain during the daytime.   Other medical conditions are listed as follows:   Essential hypertension  History of kidney stones  History of cholecystectomy  History of hyperlipidemia  Follow Up Instructions: She will follow up in 3 months    I discussed the assessment and treatment plan with the patient. The patient was provided an opportunity to ask questions and all were answered. The patient agreed with the plan and demonstrated an understanding of the instructions.   The patient was advised to call back or seek an in-person evaluation if the symptoms worsen or if the condition fails to improve as anticipated.  I provided 30 minutes of non-face-to-face time during this encounter.  Pollyann Savoy, MD   Scribed by-  Sherron Ales, PA-C

## 2019-04-25 NOTE — Telephone Encounter (Signed)
As long as the patient does not have signs or symptoms of an infection it should be fine for her to receive the vaccination.  If she has a fever please advise her to postpone.

## 2019-04-26 ENCOUNTER — Encounter: Payer: Self-pay | Admitting: Rheumatology

## 2019-04-26 ENCOUNTER — Other Ambulatory Visit: Payer: Self-pay

## 2019-04-26 ENCOUNTER — Telehealth (INDEPENDENT_AMBULATORY_CARE_PROVIDER_SITE_OTHER): Payer: BC Managed Care – PPO | Admitting: Rheumatology

## 2019-04-26 DIAGNOSIS — Z8639 Personal history of other endocrine, nutritional and metabolic disease: Secondary | ICD-10-CM

## 2019-04-26 DIAGNOSIS — M0579 Rheumatoid arthritis with rheumatoid factor of multiple sites without organ or systems involvement: Secondary | ICD-10-CM

## 2019-04-26 DIAGNOSIS — M5136 Other intervertebral disc degeneration, lumbar region: Secondary | ICD-10-CM

## 2019-04-26 DIAGNOSIS — M3500 Sicca syndrome, unspecified: Secondary | ICD-10-CM

## 2019-04-26 DIAGNOSIS — Z87442 Personal history of urinary calculi: Secondary | ICD-10-CM

## 2019-04-26 DIAGNOSIS — M170 Bilateral primary osteoarthritis of knee: Secondary | ICD-10-CM | POA: Diagnosis not present

## 2019-04-26 DIAGNOSIS — Z79899 Other long term (current) drug therapy: Secondary | ICD-10-CM | POA: Diagnosis not present

## 2019-04-26 DIAGNOSIS — M19072 Primary osteoarthritis, left ankle and foot: Secondary | ICD-10-CM

## 2019-04-26 DIAGNOSIS — M19071 Primary osteoarthritis, right ankle and foot: Secondary | ICD-10-CM

## 2019-04-26 DIAGNOSIS — Z9049 Acquired absence of other specified parts of digestive tract: Secondary | ICD-10-CM

## 2019-04-26 DIAGNOSIS — M722 Plantar fascial fibromatosis: Secondary | ICD-10-CM

## 2019-04-26 DIAGNOSIS — M778 Other enthesopathies, not elsewhere classified: Secondary | ICD-10-CM

## 2019-04-26 DIAGNOSIS — I10 Essential (primary) hypertension: Secondary | ICD-10-CM

## 2019-04-27 NOTE — Patient Instructions (Signed)
Standing Labs We placed an order today for your standing lab work.    Please come back and get your standing labs in 1 month and then every 3 months.   We have open lab daily Monday through Thursday from 8:30-12:30 PM and 1:30-4:30 PM and Friday from 8:30-12:30 PM and 1:30-4:00 PM at the office of Dr. Shaili Deveshwar.   You may experience shorter wait times on Monday and Friday afternoons. The office is located at [address], [address], [place], [place] [address] No appointment is necessary.   Labs are drawn by [facility].  You may receive a bill from [facility] for your lab work.  If you wish to have your labs drawn at another location, please call the office 24 hours in advance to send orders.  If you have any questions regarding directions or hours of operation,  please call [phone].   Just as a reminder please drink plenty of water prior to coming for your lab work. Thanks!     Hydroxychloroquine tablets What is this medicine? HYDROXYCHLOROQUINE (hye drox ee KLOR oh kwin) is used to treat rheumatoid arthritis and systemic lupus erythematosus. It is also used to treat malaria. This medicine may be used for other purposes; ask your health care provider or pharmacist if you have questions. COMMON BRAND NAME(S): Plaquenil, Quineprox What should I tell my health care provider before I take this medicine? They need to know if you have any of these conditions:  diabetes  eye disease, vision problems  G6PD deficiency  heart disease  history of irregular heartbeat  if you often drink alcohol  kidney disease  liver disease  porphyria  psoriasis  an unusual or allergic reaction to chloroquine, hydroxychloroquine, other medicines, foods, dyes, or preservatives  pregnant or trying to get pregnant  breast-feeding How should I use this medicine? Take this medicine by mouth with a glass of water. Follow the directions on the prescription label. Do not cut, crush or chew  this medicine. Swallow the tablets whole. Take this medicine with food. Avoid taking antacids within 4 hours of taking this medicine. It is best to separate these medicines by at least 4 hours. Take your medicine at regular intervals. Do not take it more often than directed. Take all of your medicine as directed even if you think you are better. Do not skip doses or stop your medicine early. Talk to your pediatrician regarding the use of this medicine in children. While this drug may be prescribed for selected conditions, precautions do apply. Overdosage: If you think you have taken too much of this medicine contact a poison control center or emergency room at once. NOTE: This medicine is only for you. Do not share this medicine with others. What if I miss a dose? If you miss a dose, take it as soon as you can. If it is almost time for your next dose, take only that dose. Do not take double or extra doses. What may interact with this medicine? Do not take this medicine with any of the following medications:  cisapride  dronedarone  pimozide  thioridazine This medicine may also interact with the following medications:  ampicillin  antacids  cimetidine  cyclosporine  digoxin  kaolin  medicines for diabetes, like insulin, glipizide, glyburide  medicines for seizures like carbamazepine, phenobarbital, phenytoin  mefloquine  methotrexate  other medicines that prolong the QT interval (cause an abnormal heart rhythm)  praziquantel This list may not describe all possible interactions. Give your health care   provider a list of all the medicines, herbs, non-prescription drugs, or dietary supplements you use. Also tell them if you smoke, drink alcohol, or use illegal drugs. Some items may interact with your medicine. What should I watch for while using this medicine? Visit your health care professional for regular checks on your progress. Tell your health care professional if your  symptoms do not start to get better or if they get worse. You may need blood work done while you are taking this medicine. If you take other medicines that can affect heart rhythm, you may need more testing. Talk to your health care professional if you have questions. Your vision may be tested before and during use of this medicine. Tell your health care professional right away if you have any change in your eyesight. What side effects may I notice from receiving this medicine? Side effects that you should report to your doctor or health care professional as soon as possible:  allergic reactions like skin rash, itching or hives, swelling of the face, lips, or tongue  changes in vision  decreased hearing or ringing of the ears  muscle weakness  redness, blistering, peeling or loosening of the skin, including inside the mouth  sensitivity to light  signs and symptoms of a dangerous change in heartbeat or heart rhythm like chest pain; dizziness; fast or irregular heartbeat; palpitations; feeling faint or lightheaded, falls; breathing problems  signs and symptoms of liver injury like dark yellow or brown urine; general ill feeling or flu-like symptoms; light-colored stools; loss of appetite; nausea; right upper belly pain; unusually weak or tired; yellowing of the eyes or skin  signs and symptoms of low blood sugar such as feeling anxious; confusion; dizziness; increased hunger; unusually weak or tired; sweating; shakiness; cold; irritable; headache; blurred vision; fast heartbeat; loss of consciousness  suicidal thoughts  uncontrollable head, mouth, neck, arm, or leg movements Side effects that usually do not require medical attention (report to your doctor or health care professional if they continue or are bothersome):  diarrhea  dizziness  hair loss  headache  irritable  loss of appetite  nausea, vomiting  stomach pain This list may not describe all possible side effects.  Call your doctor for medical advice about side effects. You may report side effects to FDA at 1-800-FDA-1088. Where should I keep my medicine? Keep out of the reach of children. Store at room temperature between 15 and 30 degrees C (59 and 86 degrees F). Protect from moisture and light. Throw away any unused medicine after the expiration date. NOTE: This sheet is a summary. It may not cover all possible information. If you have questions about this medicine, talk to your doctor, pharmacist, or health care provider.  2020 Elsevier/Gold Standard (2018-07-25 12:56:32)  

## 2019-04-28 ENCOUNTER — Telehealth: Payer: Self-pay | Admitting: *Deleted

## 2019-04-28 ENCOUNTER — Other Ambulatory Visit: Payer: Self-pay | Admitting: *Deleted

## 2019-04-28 ENCOUNTER — Ambulatory Visit: Payer: BC Managed Care – PPO | Admitting: Rheumatology

## 2019-04-28 DIAGNOSIS — Z79899 Other long term (current) drug therapy: Secondary | ICD-10-CM

## 2019-04-28 NOTE — Telephone Encounter (Signed)
PLQ consent form signed.

## 2019-05-01 ENCOUNTER — Other Ambulatory Visit: Payer: Self-pay | Admitting: Rheumatology

## 2019-05-01 NOTE — Telephone Encounter (Signed)
Last Visit: 04/26/19 Next Visit: 07/21/19 Labs: 04/21/19 Glucose was 124, rest of CMP WNL.  CBC WNL. TB Gold: 08/23/2018   Okay to refill per Dr. Corliss Skains

## 2019-05-02 MED FILL — ORENCIA CLICKJECT 125 MG/ML: 125 | 28 days supply | Qty: 4 | Fill #0

## 2019-05-08 MED ORDER — HYDROXYCHLOROQUINE SULFATE 200 MG PO TABS: 200.0000 mg | ORAL_TABLET | Freq: Two times a day (BID) | ORAL | 1 refills | Status: DC

## 2019-05-08 NOTE — Telephone Encounter (Signed)
-----   Message from Audrie Lia, RT sent at 04/27/2019 11:28 AM EST ----- Regarding: PLQ RX PENDING CONSENT - COMING TO OFFICE TO CONSENT ON 04/27/2019

## 2019-05-08 NOTE — Telephone Encounter (Signed)
Per office note dose of Plaquenil 200 mg 1 tablet by mouth twice daily.

## 2019-05-30 MED FILL — ORENCIA CLICKJECT 125 MG/ML: 125 | 28 days supply | Qty: 4 | Fill #1

## 2019-06-08 ENCOUNTER — Other Ambulatory Visit: Payer: Self-pay | Admitting: Rheumatology

## 2019-06-08 NOTE — Telephone Encounter (Signed)
Last Visit: 04/26/19 Next Visit: 07/21/19 Labs: 04/21/19 Glucose was 124, rest of CMP WNL. CBC WNL.  Okay to refill per Dr. Corliss Skains

## 2019-06-20 NOTE — Telephone Encounter (Signed)
I agree with the plan to proceed with a nerve conduction study.  Has she missed any doses of her medications?  If she is having increased wrist joint pain and inflammation the paresthesias may be due to underlying rheumatoid arthritis causing inflammation around the median nerve. It is difficult to see if there is inflammation when looking at the pictures.  Please advise patient to schedule a sooner office visit for further evaluation.

## 2019-06-21 MED FILL — ORENCIA CLICKJECT 125 MG/ML: 125 | 28 days supply | Qty: 4 | Fill #2

## 2019-06-21 NOTE — Progress Notes (Signed)
Office Visit Note  Patient: Jessica Rice             Date of Birth: [birth year]           MRN: [id number]             PCP: Marrian Salvage, PA-C Referring: Marrian Salvage, PA-C Visit Date: 06/22/2019 Occupation: @GUAROCC @  Subjective:  Pain in both hands   History of Present Illness: Jessica Rice is a 54 y.o. female with history of seropositive rheumatoid arthritis, osteoarthritis, and DDD.  She is on Orencia 125 mg sq injections once weekly, Rasuvo 15 mg sq once weekly, folic acid 2 mg po daily, and plaquenil 200 mg 1 tablet daily.  She states in mid-January she started having paresthesias in both hands.  She states her symptoms progressively worsened and she developed nocturnal symptoms.  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  She was evaluated by her PCP who prescribed a week of prednisone, which helped with her discomfort and paresthesias.  She is having difficulty extending the right index finger and intermittent paresthesias in the right hand. She denies any other joint pain or joint swelling at this time.     Activities of Daily Living:  Patient reports joint stiffness all day  Patient Reports nocturnal pain.  Difficulty dressing/grooming: Reports Difficulty climbing stairs: Denies Difficulty getting out of chair: Denies Difficulty using hands for taps, buttons, cutlery, and/or writing: Reports  Review of Systems  Constitutional: Positive for fatigue.  HENT: Positive for mouth dryness. Negative for mouth sores and nose dryness.   Eyes: Positive for dryness.  Respiratory: Negative for shortness of breath, wheezing and difficulty breathing.   Cardiovascular: Negative for chest pain and palpitations.  Gastrointestinal: Negative for blood in stool, constipation and diarrhea.  Endocrine: Negative for increased urination.  Genitourinary: Negative for difficulty urinating and painful urination.  Musculoskeletal: Positive for arthralgias, joint pain,  joint swelling and morning stiffness. Negative for myalgias, muscle tenderness and myalgias.  Skin: Negative for rash, hair loss and redness.  Allergic/Immunologic: Negative for susceptible to infections.  Neurological: Positive for numbness. Negative for dizziness, headaches, memory loss and weakness.  Hematological: Negative for bruising/bleeding tendency.  Psychiatric/Behavioral: Negative for confusion.    PMFS History:  Patient Active Problem List   Diagnosis Date Noted  . Rheumatoid arthritis involving multiple sites with positive rheumatoid factor (HCC) +CCP  09/28/2016  . High risk medication use 09/28/2016  . Right shoulder tendonitis 09/28/2016  . Plantar fasciitis 09/28/2016  . DDD (degenerative disc disease), lumbar 09/28/2016  . History of kidney stones 09/28/2016  . History of cholecystectomy 09/28/2016  . History of hyperlipidemia 09/28/2016  . Sicca syndrome (HCC) 09/28/2016    Past Medical History:  Diagnosis Date  . Dry eyes   . Rheumatoid arthritis (HCC)     Family History  Problem Relation Age of Onset  . Rheumatologic disease Mother   . Cancer Mother   . Hypertension Father   . Heart disease Father   . Diabetes Brother    Past Surgical History:  Procedure Laterality Date  . GALLBLADDER SURGERY     Social History   Social History Narrative  . Not on file   Immunization History  Administered Date(s) Administered  . Influenza Inj Mdck Quad With Preservative 04/15/2018  . Influenza,inj,Quad PF,6+ Mos 01/26/2017     Objective: Vital Signs: BP 130/82 (BP Location: Left Arm, Patient Position: Sitting, Cuff Size: Normal)   Pulse 77  Resp 15   Ht 5\' 5"  (1.651 m)   Wt 247 lb (112 kg)   BMI 41.10 kg/m    Physical Exam Vitals and nursing note reviewed.  Constitutional:      Appearance: She is well-developed.  HENT:     Head: Normocephalic and atraumatic.  Eyes:     Conjunctiva/sclera: Conjunctivae normal.  Cardiovascular:     Rate and  Rhythm: Normal rate and regular rhythm.     Heart sounds: Normal heart sounds.  Pulmonary:     Effort: Pulmonary effort is normal.     Breath sounds: Normal breath sounds.  Abdominal:     General: Bowel sounds are normal.     Palpations: Abdomen is soft.  Musculoskeletal:     Cervical back: Normal range of motion.  Lymphadenopathy:     Cervical: No cervical adenopathy.  Skin:    General: Skin is warm and dry.     Capillary Refill: Capillary refill takes less than 2 seconds.  Neurological:     Mental Status: She is alert and oriented to person, place, and time.  Psychiatric:        Behavior: Behavior normal.      Musculoskeletal Exam: C-spine, thoracic spine, lumbar spine good range of motion.  No midline spinal tenderness.  No SI joint tenderness.  Shoulder joints, elbow joints, wrist joints, MCPs, PIPs and DIPs good range of motion with no synovitis.  She has tenderness of the right second MCP and PIP joint.  Incomplete fist formation of the right hand.  Hip joints, knee joints, ankle joints, MTPs, PIPs, DIPs good range of motion no synovitis.  No warmth or effusion of bilateral knee joints.  No tenderness or swelling of ankle joints.  CDAI Exam: CDAI Score: 3.5  Patient Global: 8 mm; Provider Global: 7 mm Swollen: 0 ; Tender: 2  Joint Exam 06/22/2019      Right  Left  MCP 2   Tender     PIP 2   Tender        Investigation: No additional findings.  Imaging: No results found.  Recent Labs: Lab Results  Component Value Date   WBC 4.8 08/23/2018   HGB 13.5 08/23/2018   PLT 161 08/23/2018   NA 139 08/23/2018   K 4.5 08/23/2018   CL 106 08/23/2018   CO2 25 08/23/2018   GLUCOSE 92 08/23/2018   BUN 15 08/23/2018   CREATININE 1.00 08/23/2018   BILITOT 0.7 08/23/2018   ALKPHOS 50 10/01/2016   AST 30 08/23/2018   ALT 27 08/23/2018   PROT 7.4 08/23/2018   ALBUMIN 3.6 10/01/2016   CALCIUM 9.7 08/23/2018   GFRAA 75 08/23/2018   QFTBGOLDPLUS NEGATIVE 08/23/2018     Speciality Comments: PLQ Eye Exam: 05/19/19 W[facility] Follow up in 6 months   Procedures:  Hand/UE Inj: R carpal tunnel for carpal tunnel syndrome on 06/22/2019 9:16 AM Indications: pain Details: 27 G needle, ultrasound-guided volar approach Medications: 0.5 mL lidocaine 1 %; 10 mg triamcinolone acetonide 40 MG/ML Aspirate: 0 mL Outcome: tolerated well, no immediate complications Procedure, treatment alternatives, risks and benefits explained, specific risks discussed. Consent was given by the patient. Immediately prior to procedure a time out was called to verify the correct patient, procedure, equipment, support staff and site/side marked as required. Patient was prepped and draped in the usual sterile fashion.   Small Joint Inj: R index MCP on 06/22/2019 9:17 AM Indications: pain Details: 27 G needle, ultrasound-guided dorsal  approach Medications: 0.5 mL lidocaine 1 %; 10 mg triamcinolone acetonide 40 MG/ML Aspirate: 0 mL Outcome: tolerated well, no immediate complications Procedure, treatment alternatives, risks and benefits explained, specific risks discussed. Consent was given by the patient. Immediately prior to procedure a time out was called to verify the correct patient, procedure, equipment, support staff and site/side marked as required. Patient was prepped and draped in the usual sterile fashion.     Allergies: Proparacaine   Assessment / Plan:     Visit Diagnoses: Rheumatoid arthritis involving multiple sites with positive rheumatoid factor (HCC) +CCP  - She had a inadequate response to Enbrel and Humira in the past: She presents today with tenderness of the right second MCP and PIP joint.  No synovitis was noted.  She has painful and limited flexion of the right index finger.  The right second MCP joint was injected with cortisone today.  She has been experiencing paresthesias in the right hand since mid January.  She requested a carpal tunnel cortisone injection  today.  She tolerated both procedures.  She is not experiencing any other joint pain or joint swelling at this time.  She is clinically been doing well on Orencia 125 mg of days injections once weekly, Plaquenil 200 mg 1 tablet twice daily, and Rasuvo 15 mg subcu injections once weekly.  She has not missed any doses of these medications recently.   She has a history of elevated creatinine so we cannot increase the dose of Rasuvo further.We discussed switching her from Rasuvo to [place] but she declined at this time.  She will continue on the current treatment regimen.  She was advised to notify us if she develops increased joint pain or joint swelling. She will follow-up in the office in 3 months  High risk medication use - PLQ 200 mg BID, Orencia Clickject 125 mg/ml, Rasuvo 15 mg sq weekly injections (Inadequate response to tablets), (reduced dose of Rasuvo due to elevated creatinine) and folic acid 1 mg 2 tablets daily.Eye Exam: 05/19/19.  CBC and CMP were drawn on 04/21/2019.  She will be due to update lab work in April and every 3 months.  TB gold was negative on 08/23/2018.  Future order for TB gold was placed today.- Plan: QuantiFERON-TB Gold Plus  Right hand paresthesia: She has been experiencing right hand paresthesias since mid January 2021.  She has been typing more on a daily basis since itching virtually.  She was evaluated by her PCP in February and her PCP felt her symptoms were related to possible carpal tunnel syndrome.  She was scheduled for a nerve conduction study on 07/11/2019.  She was prescribed a prednisone taper which helped with her overall discomfort and paresthesias.  Her symptoms have returned and she is experiencing severe pain and paresthesias in the right hand.  A right carpal tunnel cortisone injection was performed today.  She tolerated procedure well.  The procedure note was completed above.  Primary osteoarthritis of both knees: She has good range of motion of both knee joints with  no discomfort.  No warmth or effusion was noted.  Primary osteoarthritis of both feet: She is not having any discomfort in her feet at this time.  She is good range of motion of both ankle joints with no discomfort.  No synovitis was noted.  Plantar fasciitis - Resolved  DDD (degenerative disc disease), lumbar: She has good range of motion with no discomfort at this time.  She has no symptoms of radiculopathy.  Sicca  syndrome [facility]): She continues to have chronic sicca symptoms which have been tolerable.  Right shoulder tendonitis: She has good range of motion with no discomfort at this time.  Other medical conditions are listed as follows:  History of cholecystectomy  Essential hypertension  History of kidney stones  History of hyperlipidemia  Orders: Orders Placed This Encounter  Procedures  . Medium Joint Inj  . Hand/UE Inj  . Small Joint Inj  . QuantiFERON-TB Gold Plus   Meds ordered this encounter  Medications  . hydroxychloroquine (PLAQUENIL) 200 MG tablet    Sig: Take 1 tablet (200 mg total) by mouth 2 (two) times daily.    Dispense:  180 tablet    Refill:  0    Face-to-face time spent with patient was 30 minutes. Greater than 50% of time was spent in counseling and coordination of care.  Follow-Up Instructions: Return in about 3 months (around 09/22/2019) for Rheumatoid arthritis.   Jessica Bienenstock, PA-C   I examined and evaluated the patient with Jessica Ales PA.  Patient has been experiencing pain and discomfort in her bilateral hands and tingling sensation.  Per her request right wrist joint ultrasound examination was performed.  Her right median nerve was more than upper limits of normal.  Carpal tunnel injection was performed and the procedure as described above.  Her rheumatoid arthritis is fairly well controlled except for her right second MCP swelling.  Per her request right second MCP injection was also performed after different treatment options and their  side effects were discussed.  She tolerated the procedure well.  The plan of care was discussed as noted above.  Jessica Savoy, MD  Note - This record has been created using Animal nutritionist.  Chart creation errors have been sought, but may not always  have been located. Such creation errors do not reflect on  the standard of medical care.

## 2019-06-21 NOTE — Telephone Encounter (Signed)
Patient scheduled for 06/22/19 @ 8:40 am.

## 2019-06-22 ENCOUNTER — Telehealth: Payer: Self-pay | Admitting: *Deleted

## 2019-06-22 ENCOUNTER — Ambulatory Visit: Payer: BC Managed Care – PPO | Admitting: Physician Assistant

## 2019-06-22 ENCOUNTER — Other Ambulatory Visit: Payer: Self-pay

## 2019-06-22 ENCOUNTER — Ambulatory Visit: Payer: Self-pay

## 2019-06-22 ENCOUNTER — Encounter: Payer: Self-pay | Admitting: Physician Assistant

## 2019-06-22 ENCOUNTER — Other Ambulatory Visit: Payer: Self-pay | Admitting: *Deleted

## 2019-06-22 VITALS — BP 130/82 | HR 77 | Resp 15 | Ht 65.0 in | Wt 247.0 lb

## 2019-06-22 DIAGNOSIS — R202 Paresthesia of skin: Secondary | ICD-10-CM

## 2019-06-22 DIAGNOSIS — M19071 Primary osteoarthritis, right ankle and foot: Secondary | ICD-10-CM

## 2019-06-22 DIAGNOSIS — M79641 Pain in right hand: Secondary | ICD-10-CM

## 2019-06-22 DIAGNOSIS — M5136 Other intervertebral disc degeneration, lumbar region: Secondary | ICD-10-CM

## 2019-06-22 DIAGNOSIS — Z79899 Other long term (current) drug therapy: Secondary | ICD-10-CM | POA: Diagnosis not present

## 2019-06-22 DIAGNOSIS — I10 Essential (primary) hypertension: Secondary | ICD-10-CM

## 2019-06-22 DIAGNOSIS — M170 Bilateral primary osteoarthritis of knee: Secondary | ICD-10-CM

## 2019-06-22 DIAGNOSIS — M0579 Rheumatoid arthritis with rheumatoid factor of multiple sites without organ or systems involvement: Secondary | ICD-10-CM | POA: Diagnosis not present

## 2019-06-22 DIAGNOSIS — M722 Plantar fascial fibromatosis: Secondary | ICD-10-CM

## 2019-06-22 DIAGNOSIS — M778 Other enthesopathies, not elsewhere classified: Secondary | ICD-10-CM

## 2019-06-22 DIAGNOSIS — M3500 Sicca syndrome, unspecified: Secondary | ICD-10-CM

## 2019-06-22 DIAGNOSIS — Z87442 Personal history of urinary calculi: Secondary | ICD-10-CM

## 2019-06-22 DIAGNOSIS — Z111 Encounter for screening for respiratory tuberculosis: Secondary | ICD-10-CM

## 2019-06-22 DIAGNOSIS — Z9049 Acquired absence of other specified parts of digestive tract: Secondary | ICD-10-CM

## 2019-06-22 DIAGNOSIS — M19072 Primary osteoarthritis, left ankle and foot: Secondary | ICD-10-CM

## 2019-06-22 DIAGNOSIS — Z8639 Personal history of other endocrine, nutritional and metabolic disease: Secondary | ICD-10-CM

## 2019-06-22 MED ORDER — HYDROXYCHLOROQUINE SULFATE 200 MG PO TABS: 200.0000 mg | ORAL_TABLET | Freq: Two times a day (BID) | ORAL | 0 refills | Status: DC

## 2019-06-22 MED ORDER — LIDOCAINE HCL 1 % IJ SOLN
0.5000 mL | INTRAMUSCULAR | Status: AC | PRN
  Administered 2019-06-22: .5 mL

## 2019-06-22 MED ORDER — TRIAMCINOLONE ACETONIDE 40 MG/ML IJ SUSP
10.0000 mg | INTRAMUSCULAR | Status: AC | PRN
  Administered 2019-06-22: 10 mg

## 2019-06-22 MED ORDER — TRIAMCINOLONE ACETONIDE 40 MG/ML IJ SUSP
10.0000 mg | INTRAMUSCULAR | Status: AC | PRN
  Administered 2019-06-22: 10 mg via INTRA_ARTICULAR

## 2019-06-22 NOTE — Telephone Encounter (Signed)
Per Dr. Corliss Skains, the burning sensation is normal. Patient advised per Dr. Corliss Skains she may use ice or cold water. Patient verbalized understanding and states the burning sensation is getting better.

## 2019-06-22 NOTE — Telephone Encounter (Signed)
Patient states she just received a cortisone injection. Patient states her hand is "burning like it is on fire." Patient states is happened after leaving the office. Patient has no itching or any other symptoms. Patient states the burning started very intense and has subsided a little bit. Please advise.

## 2019-06-27 ENCOUNTER — Other Ambulatory Visit: Payer: Self-pay | Admitting: Rheumatology

## 2019-06-27 NOTE — Telephone Encounter (Signed)
Last Visit: 06/22/19 Next Visit: 09/12/19  Okay to refill per Dr. Corliss Skains

## 2019-06-29 ENCOUNTER — Other Ambulatory Visit: Payer: Self-pay | Admitting: Rheumatology

## 2019-07-21 ENCOUNTER — Ambulatory Visit: Payer: BC Managed Care – PPO | Admitting: Rheumatology

## 2019-07-24 ENCOUNTER — Other Ambulatory Visit: Payer: Self-pay | Admitting: Rheumatology

## 2019-07-24 NOTE — Telephone Encounter (Addendum)
Last Visit: 06/22/2019 Next Visit: 09/12/2019 Labs: 04/21/2019  TB Gold: 08/23/2018 negative   Advised patient she is due to update labs, patient verbalized understanding and will go to [facility] this week. Advised patient to call prior to going so orders can be released.   Okay to refill 30 day supply, per Dr. Corliss Skains.

## 2019-07-25 MED FILL — ORENCIA CLICKJECT 125 MG/ML: 125 | 28 days supply | Qty: 4 | Fill #0

## 2019-07-27 ENCOUNTER — Encounter: Payer: Self-pay | Admitting: Rheumatology

## 2019-08-21 ENCOUNTER — Other Ambulatory Visit: Payer: Self-pay

## 2019-08-21 ENCOUNTER — Other Ambulatory Visit: Payer: Self-pay | Admitting: Rheumatology

## 2019-08-21 DIAGNOSIS — Z79899 Other long term (current) drug therapy: Secondary | ICD-10-CM

## 2019-08-21 DIAGNOSIS — Z111 Encounter for screening for respiratory tuberculosis: Secondary | ICD-10-CM

## 2019-08-21 NOTE — Telephone Encounter (Signed)
Last Visit: 06/22/2019 Next Visit: 09/12/2019 Labs:   I called patient's PCP and they are faxing lab results.

## 2019-08-22 NOTE — Telephone Encounter (Signed)
Ok to refill Orencia

## 2019-08-22 NOTE — Telephone Encounter (Addendum)
Labs received from PCP drawn on 07/27/2019.  Reviewed by Sherron Ales, PA-C   Glucose 100, Creatinine 1.08, GFR 59, Platelets 143  Creatinine is trending up. Was 0.99 on 04/21/2019.   Attempted to contact patient to and left message for patient to call the office. Per Sherron Ales, PA-C will need to ask patient if she has been taking NSAIDS or if she was dehydrated.  Patient returned call and states she has been taking ibuprofen due to headaches.   TB Gold: 08/23/2018 Patient will update TB Gold this week.   Patient is requesting a 2 month supply on her [place] as she will be travelling out of town.  Okay to refill Orencia?

## 2019-08-23 MED FILL — ORENCIA CLICKJECT 125 MG/ML: 125 | 56 days supply | Qty: 8 | Fill #0

## 2019-08-29 NOTE — Progress Notes (Signed)
Office Visit Note  Patient: Jessica Rice             Date of Birth: March 22, 1966           MRN: [id number]             PCP: Aldean Ast, PA-C Referring: Aldean Ast, PA-C Visit Date: 09/12/2019 Occupation: @GUAROCC @  Subjective:  Right SI joint pain   History of Present Illness: Jessica Rice is a 54 y.o. female with history of seropositive rheumatoid arthritis, osteoarthritis, and DDD.  Patient is on Orencia 125 mg sq injections every week, Rasuvo 15 mg sq injections once weekly, folic acid 2 mg by mouth daily, and Plaquenil 200 mg 1 tablet by mouth twice daily.  She has not missed any doses of Orencia, Rasuvo, or Plaquenil recently.  She denies any recent rheumatoid arthritis flares.  She states that she is currently experiencing some increased discomfort in the right SI joint which she attributes to riding in the car to [place] and walking for prolonged period of time yesterday.  She states that her SI joint pain is typically exacerbated by sitting for prolonged periods of time.  She denies any other joint pain or joint swelling at this time.  She denies any new concerns.   Activities of Daily Living:  Patient reports morning stiffness for 0 minutes.   Patient Denies nocturnal pain.  Difficulty dressing/grooming: Denies Difficulty climbing stairs: Denies Difficulty getting out of chair: Denies Difficulty using hands for taps, buttons, cutlery, and/or writing: Denies  Review of Systems  Constitutional: Negative for fatigue.  HENT: Positive for mouth dryness. Negative for mouth sores and nose dryness.   Eyes: Positive for dryness. Negative for pain and itching.  Respiratory: Negative for shortness of breath and difficulty breathing.   Cardiovascular: Negative for chest pain and palpitations.  Gastrointestinal: Negative for blood in stool, constipation and diarrhea.  Endocrine: Negative for increased urination.  Genitourinary: Negative for difficulty urinating.    Musculoskeletal: Negative for arthralgias, joint pain, joint swelling, myalgias, morning stiffness, muscle tenderness and myalgias.  Skin: Positive for color change. Negative for rash and redness.  Allergic/Immunologic: Negative for susceptible to infections.  Neurological: Positive for numbness and headaches. Negative for dizziness, memory loss and weakness.  Hematological: Negative for bruising/bleeding tendency.  Psychiatric/Behavioral: Positive for sleep disturbance. Negative for confusion.    PMFS History:  Patient Active Problem List   Diagnosis Date Noted  . Rheumatoid arthritis involving multiple sites with positive rheumatoid factor ([place]) +CCP  09/28/2016  . High risk medication use 09/28/2016  . Right shoulder tendonitis 09/28/2016  . Plantar fasciitis 09/28/2016  . DDD (degenerative disc disease), lumbar 09/28/2016  . History of kidney stones 09/28/2016  . History of cholecystectomy 09/28/2016  . History of hyperlipidemia 09/28/2016  . Sicca syndrome ([place]) 09/28/2016    Past Medical History:  Diagnosis Date  . Dry eyes   . Rheumatoid arthritis ([place])     Family History  Problem Relation Age of Onset  . Rheumatologic disease Mother   . Cancer Mother   . Hypertension Father   . Heart disease Father   . Diabetes Brother    Past Surgical History:  Procedure Laterality Date  . GALLBLADDER SURGERY     Social History   Social History Narrative  . Not on file   Immunization History  Administered Date(s) Administered  . Influenza Inj Mdck Quad With Preservative 04/15/2018  . Influenza,inj,Quad PF,6+ Mos 01/26/2017     Objective: Vital Signs:  BP 116/84 (BP Location: Left Arm, Patient Position: Sitting, Cuff Size: Normal)   Pulse 81   Resp 15   Ht 5\' 5"  (1.651 m)   Wt 247 lb (112 kg)   BMI 41.10 kg/m    Physical Exam Vitals and nursing note reviewed.  Constitutional:      Appearance: She is well-developed.  HENT:     Head: Normocephalic and atraumatic.   Eyes:     Conjunctiva/sclera: Conjunctivae normal.  Pulmonary:     Effort: Pulmonary effort is normal.  Abdominal:     General: Bowel sounds are normal.     Palpations: Abdomen is soft.  Musculoskeletal:     Cervical back: Normal range of motion.  Lymphadenopathy:     Cervical: No cervical adenopathy.  Skin:    General: Skin is warm and dry.     Capillary Refill: Capillary refill takes less than 2 seconds.  Neurological:     Mental Status: She is alert and oriented to person, place, and time.  Psychiatric:        Behavior: Behavior normal.      Musculoskeletal Exam: C-spine, thoracic spine, lumbar spine good range of motion.  No midline spinal tenderness.  She has right SI joint tenderness.  Shoulder joints and elbow joints have good range of motion with no discomfort.  She has limited range of motion of both wrist joints but no tenderness or inflammation was noted.  MCPs, PIPs, DIPs have good range of motion with no synovitis.  She has complete fist formation bilaterally.  Hip joints have good range of motion with no discomfort.  Knee joints have good range of motion with no warmth or effusion.  Ankle joints have good range of motion with no tenderness or inflammation.  No tenderness of MTP joints.  CDAI Exam: CDAI Score: 0.2  Patient Global: 1 mm; Provider Global: 1 mm Swollen: 0 ; Tender: 0  Joint Exam 09/12/2019   No joint exam has been documented for this visit   There is currently no information documented on the homunculus. Go to the Rheumatology activity and complete the homunculus joint exam.  Investigation: No additional findings.  Imaging: No results found.  Recent Labs: Lab Results  Component Value Date   WBC 4.8 08/23/2018   HGB 13.5 08/23/2018   PLT 161 08/23/2018   NA 139 08/23/2018   K 4.5 08/23/2018   CL 106 08/23/2018   CO2 25 08/23/2018   GLUCOSE 92 08/23/2018   BUN 15 08/23/2018   CREATININE 1.00 08/23/2018   BILITOT 0.7 08/23/2018   ALKPHOS  50 10/01/2016   AST 30 08/23/2018   ALT 27 08/23/2018   PROT 7.4 08/23/2018   ALBUMIN 3.6 10/01/2016   CALCIUM 9.7 08/23/2018   GFRAA 75 08/23/2018   QFTBGOLDPLUS NEGATIVE 08/23/2018    Speciality Comments: PLQ Eye Exam: 05/19/19 WNL @ [facility] Follow up in 6 months   Procedures:  No procedures performed Allergies: Proparacaine   Assessment / Plan:     Visit Diagnoses: Rheumatoid arthritis involving multiple sites with positive rheumatoid factor (HCC) +CCP  - She had a inadequate response to Enbrel and Humira in the past: She has no tenderness or synovitis on exam.  She has not had any recent rheumatoid arthritis flares.  She has clinically been doing well on triple therapy including Plaquenil 200 mg 1 tablet by mouth twice daily, Orencia 125 mg subcutaneous injections once weekly, Rasuvo 15 mg appears injections once weekly and folic acid 2  mg by mouth daily.  She has not missed any doses of these medications recently.  She has been tolerating these medications without any side effects.  She is not having any discomfort or inflammation in her joints at this time.  She will continue on the current treatment regimen.  She was advised to notify us if she develops increased joint pain or joint swelling.  She will follow-up in the office in 5 months.  High risk medication use - PLQ 200 mg 1 tablet by mouth twice daily, Orencia Clickject 125 mg/ml, Rasuvo 15 mg sq weekly injections, and folic acid 2 mg po daily. (Inadequate response to tablets), (reduced dose of Rasuvo due to elevated creatinine).  She had updated lab work performed on 07/27/2019.  Creatinine was 1.08 and GFR was 59 at that time.  She was encouraged to continue to avoid NSAIDs.  She will be due to update lab work in July and every 3 months.  Standing orders are in place.  Primary osteoarthritis of both knees: She has good ROM of both knee joints.  No warmth or effusion of knee joints noted.    Primary osteoarthritis of both feet:  She is not having any discomfort in her feet at this time.  She has good ROM of both ankle joints with no discomfort.  No tenderness or inflammation noted.  No achilles tendonitis or plantar fasciitis.   Plantar fasciitis - Resolved  Chronic right SI joint pain: She has tenderness to palpation over the right SI joint.  She experiences intermittent discomfort in the right SI joint which is exacerbated by sitting for prolonged periods of time.  Yesterday she went to [facility] and her discomfort is exacerbated by sitting in the car and walking prolonged distances.  X-rays of the pelvis were obtained on 02/19/2018 which were consistent with osteoarthritis of bilateral SI joints.  She declined a cortisone injection today.  She was given a handout of exercises to perform.  She was advised to notify us if her discomfort persists or worsens.  DDD (degenerative disc disease), lumbar: She is not having any lumbar spine discomfort.  No midline spinal tenderness.  No symptoms of radiculopathy.  Back exercises were discussed.    Sicca syndrome (HCC): She has chronic sicca symptoms.    Right shoulder tendonitis: She has good ROM with no discomfort.    Other medical conditions are listed as follows:   Essential hypertension  History of kidney stones  History of cholecystectomy  History of hyperlipidemia  Orders: No orders of the defined types were placed in this encounter.  No orders of the defined types were placed in this encounter.     Follow-Up Instructions: Return in about 5 months (around 02/12/2020) for Rheumatoid arthritis, Osteoarthritis.   Gearldine Bienenstock, PA-C  Note - This record has been created using Dragon software.  Chart creation errors have been sought, but may not always  have been located. Such creation errors do not reflect on  the standard of medical care.

## 2019-08-31 ENCOUNTER — Encounter: Payer: Self-pay | Admitting: Rheumatology

## 2019-09-05 ENCOUNTER — Encounter: Payer: Self-pay | Admitting: Rheumatology

## 2019-09-06 MED ORDER — RASUVO 15 MG/0.3ML ~~LOC~~ SOAJ: SUBCUTANEOUS | 0 refills | Status: AC

## 2019-09-06 MED ORDER — HYDROXYCHLOROQUINE SULFATE 200 MG PO TABS: 200.0000 mg | ORAL_TABLET | Freq: Two times a day (BID) | ORAL | 0 refills | Status: AC

## 2019-09-06 NOTE — Telephone Encounter (Signed)
Last Visit: 06/22/2019 Next Visit: 09/12/2019 Labs: 07/27/2019 Glucose 100, Creatinine 1.08, GFR 59, Platelets 143 Eye exam: 05/19/19 WNL  Current Dose per office note 06/22/2019: PLQ 200 mg BID, Rasuvo 15 mg sq weekly   Okay to refill per Dr. Corliss Skains

## 2019-09-07 ENCOUNTER — Telehealth: Payer: Self-pay | Admitting: Rheumatology

## 2019-09-07 NOTE — Telephone Encounter (Signed)
Received fax from [facility] as well. Return fax advising okay to dispense as written as per [employer], pharmacist we are aware of interactions and are monitoring labs.

## 2019-09-07 NOTE — Telephone Encounter (Signed)
Alinda Money from [facility] called stating patient is currently taking Omeprazole and Rasuvo and wanted to make sure the doctor is aware of the drug interaction.   Phone #[phone]

## 2019-09-12 ENCOUNTER — Other Ambulatory Visit: Payer: Self-pay

## 2019-09-12 ENCOUNTER — Encounter: Payer: Self-pay | Admitting: Physician Assistant

## 2019-09-12 ENCOUNTER — Ambulatory Visit: Payer: BC Managed Care – PPO | Admitting: Physician Assistant

## 2019-09-12 VITALS — BP 116/84 | HR 81 | Resp 15 | Ht 65.0 in | Wt 247.0 lb

## 2019-09-12 DIAGNOSIS — M533 Sacrococcygeal disorders, not elsewhere classified: Secondary | ICD-10-CM

## 2019-09-12 DIAGNOSIS — M778 Other enthesopathies, not elsewhere classified: Secondary | ICD-10-CM

## 2019-09-12 DIAGNOSIS — G8929 Other chronic pain: Secondary | ICD-10-CM

## 2019-09-12 DIAGNOSIS — M5136 Other intervertebral disc degeneration, lumbar region: Secondary | ICD-10-CM

## 2019-09-12 DIAGNOSIS — Z8639 Personal history of other endocrine, nutritional and metabolic disease: Secondary | ICD-10-CM

## 2019-09-12 DIAGNOSIS — M722 Plantar fascial fibromatosis: Secondary | ICD-10-CM

## 2019-09-12 DIAGNOSIS — Z79899 Other long term (current) drug therapy: Secondary | ICD-10-CM

## 2019-09-12 DIAGNOSIS — Z87442 Personal history of urinary calculi: Secondary | ICD-10-CM

## 2019-09-12 DIAGNOSIS — M170 Bilateral primary osteoarthritis of knee: Secondary | ICD-10-CM

## 2019-09-12 DIAGNOSIS — M0579 Rheumatoid arthritis with rheumatoid factor of multiple sites without organ or systems involvement: Secondary | ICD-10-CM | POA: Diagnosis not present

## 2019-09-12 DIAGNOSIS — M19072 Primary osteoarthritis, left ankle and foot: Secondary | ICD-10-CM

## 2019-09-12 DIAGNOSIS — I10 Essential (primary) hypertension: Secondary | ICD-10-CM

## 2019-09-12 DIAGNOSIS — M19071 Primary osteoarthritis, right ankle and foot: Secondary | ICD-10-CM | POA: Diagnosis not present

## 2019-09-12 DIAGNOSIS — M3500 Sicca syndrome, unspecified: Secondary | ICD-10-CM

## 2019-09-12 DIAGNOSIS — Z9049 Acquired absence of other specified parts of digestive tract: Secondary | ICD-10-CM

## 2019-09-12 NOTE — Patient Instructions (Signed)
Standing Labs We placed an order today for your standing lab work.    Please come back and get your standing labs in July and every 3 months   We have open lab daily Monday through Thursday from 8:30-12:30 PM and 1:30-4:30 PM and Friday from 8:30-12:30 PM and 1:30-4:00 PM at the office of Dr. Pollyann Savoy.   You may experience shorter wait times on Monday and Friday afternoons. The office is located at [address], [address], [place], [place] [address] No appointment is necessary.   Labs are drawn by [facility].  You may receive a bill from [facility] for your lab work.  If you wish to have your labs drawn at another location, please call the office 24 hours in advance to send orders.  If you have any questions regarding directions or hours of operation,  please call [phone].   Just as a reminder please drink plenty of water prior to coming for your lab work. Thanks!    Sacroiliac Joint Dysfunction  Sacroiliac joint dysfunction is a condition that causes inflammation on one or both sides of the sacroiliac (SI) joint. The SI joint connects the lower part of the spine (sacrum) with the two upper portions of the pelvis (ilium). This condition causes deep aching or burning pain in the low back. In some cases, the pain may also spread into one or both buttocks, hips, or thighs. What are the causes? This condition may be caused by:  Pregnancy. During pregnancy, extra stress is put on the SI joints because the pelvis widens.  Injury, such as: ? Injuries from car accidents. ? Sports-related injuries. ? Work-related injuries.  Having one leg that is shorter than the other.  Conditions that affect the joints, such as: ? Rheumatoid arthritis. ? Gout. ? Psoriatic arthritis. ? Joint infection (septic arthritis). Sometimes, the cause of SI joint dysfunction is not known. What are the signs or symptoms? Symptoms of this condition include:  Aching or burning pain in the lower back.  The pain may also spread to other areas, such as: ? Buttocks. ? Groin. ? Thighs.  Muscle spasms in or around the painful areas.  Increased pain when standing, walking, running, stair climbing, bending, or lifting. How is this diagnosed? This condition is diagnosed with a physical exam and medical history. During the exam, the health care provider may move one or both of your legs to different positions to check for pain. Various tests may be done to confirm the diagnosis, including:  Imaging tests to look for other causes of pain. These may include: ? MRI. ? CT scan. ? Bone scan.  Diagnostic injection. A numbing medicine is injected into the SI joint using a needle. If your pain is temporarily improved or stopped after the injection, this can indicate that SI joint dysfunction is the problem. How is this treated? Treatment depends on the cause and severity of your condition. Treatment options may include:  Ice or heat applied to the lower back area after an injury. This may help reduce pain and muscle spasms.  Medicines to relieve pain or inflammation or to relax the muscles.  Wearing a back brace (sacroiliac brace) to help support the joint while your back is healing.  Physical therapy to increase muscle strength around the joint and flexibility at the joint. This may also involve learning proper body positions and ways of moving to relieve stress on the joint.  Direct manipulation of the SI joint.  Injections of steroid medicine into the  joint to reduce pain and swelling.  Radiofrequency ablation to burn away nerves that are carrying pain messages from the joint.  Use of a device that provides electrical stimulation to help reduce pain at the joint.  Surgery to put in screws and plates that limit or prevent joint motion. This is rare. Follow these instructions at home: Medicines  Take over-the-counter and prescription medicines only as told by your health care  provider.  Do not drive or use heavy machinery while taking prescription pain medicine.  If you are taking prescription pain medicine, take actions to prevent or treat constipation. Your health care provider may recommend that you: ? Drink enough fluid to keep your urine pale yellow. ? Eat foods that are high in fiber, such as fresh fruits and vegetables, whole grains, and beans. ? Limit foods that are high in fat and processed sugars, such as fried or sweet foods. ? Take an over-the-counter or prescription medicine for constipation. If you have a brace:  Wear the brace as told by your health care provider. Remove it only as told by your health care provider.  Keep the brace clean.  If the brace is not waterproof: ? Do not let it get wet. ? Cover it with a watertight covering when you take a bath or a shower. Managing pain, stiffness, and swelling      Icing can help with pain and swelling. Heat may help with muscle tension or spasms. Ask your health care provider if you should use ice or heat.  If directed, put ice on the affected area: ? If you have a removable brace, remove it as told by your health care provider. ? Put ice in a plastic bag. ? Place a towel between your skin and the bag. ? Leave the ice on for 20 minutes, 2-3 times a day.  If directed, apply heat to the affected area. Use the heat source that your health care provider recommends, such as a moist heat pack or a heating pad. ? Place a towel between your skin and the heat source. ? Leave the heat on for 20-30 minutes. ? Remove the heat if your skin turns bright red. This is especially important if you are unable to feel pain, heat, or cold. You may have a greater risk of getting burned. General instructions  Rest as needed. Ask your health care provider what activities are safe for you.  Return to your normal activities as told by your health care provider.  Exercise as directed by your health care provider  or physical therapist.  Do not use any products that contain nicotine or tobacco, such as cigarettes and e-cigarettes. These can delay bone healing. If you need help quitting, ask your health care provider.  Keep all follow-up visits as told by your health care provider. This is important. Contact a health care provider if:  Your pain is not controlled with medicine.  You have a fever.  Your pain is getting worse. Get help right away if:  You have weakness, numbness, or tingling in your legs or feet.  You lose control of your bladder or bowel. Summary  Sacroiliac joint dysfunction is a condition that causes inflammation on one or both sides of the sacroiliac (SI) joint.  This condition causes deep aching or burning pain in the low back. In some cases, the pain may also spread into one or both buttocks, hips, or thighs.  Treatment depends on the cause and severity of your condition. It may  include medicines to reduce pain and swelling or to relax muscles. This information is not intended to replace advice given to you by your health care provider. Make sure you discuss any questions you have with your health care provider. Document Revised: 11/10/2017 Document Reviewed: 04/26/2017 Elsevier Patient Education  2020 [employer].

## 2019-10-09 ENCOUNTER — Telehealth: Payer: Self-pay

## 2019-10-09 NOTE — Telephone Encounter (Signed)
I called patient's husband, Harvie Heck.  He stated that patient acquired infection prior to her trip to [place].  Her condition deteriorated while she was in [place] and was hospitalized.  She was in intensive care unit.  She was taken off life support as oxygen saturation was in the 70s.  He requested that we call the pharmacy and cancel her prescriptions which we have already done.  I offered my condolences.

## 2019-10-09 NOTE — Telephone Encounter (Signed)
Jessica Salvage, PA-C called to inform Dr. Corliss Skains that patient has passed away. Patient was in [place] and was diagnosed with COVID-pneumonia. [name] requested that we call the pharmacy and cancel the prescriptions that are prescribed by our office.   I called [name] to cancel orencia, [facility] to cancel Rasuvo and plaquenil and [facility] in [place] to cancel folic acid.

## 2019-10-10 NOTE — Telephone Encounter (Signed)
Please address card. Thank you.

## 2019-10-10 NOTE — Telephone Encounter (Signed)
Card Addressed.

## 2019-10-29 DEATH — deceased

## 2019-11-10 ENCOUNTER — Other Ambulatory Visit: Payer: Self-pay | Admitting: Rheumatology

## 2019-11-29 DEATH — deceased

## 2020-02-13 ENCOUNTER — Ambulatory Visit: Payer: BC Managed Care – PPO | Admitting: Physician Assistant
